# Patient Record
Sex: Female | Born: 1965
Health system: Southern US, Community
[De-identification: ages and names within clinical notes are randomized; demographics above are authoritative.]

## PROBLEM LIST (undated history)

## (undated) DIAGNOSIS — M545 Low back pain, unspecified: Secondary | ICD-10-CM

## (undated) DIAGNOSIS — R739 Hyperglycemia, unspecified: Secondary | ICD-10-CM

## (undated) DIAGNOSIS — K219 Gastro-esophageal reflux disease without esophagitis: Secondary | ICD-10-CM

## (undated) DIAGNOSIS — R600 Localized edema: Secondary | ICD-10-CM

## (undated) DIAGNOSIS — E663 Overweight: Secondary | ICD-10-CM

## (undated) DIAGNOSIS — I10 Essential (primary) hypertension: Secondary | ICD-10-CM

## (undated) DIAGNOSIS — F419 Anxiety disorder, unspecified: Secondary | ICD-10-CM

## (undated) DIAGNOSIS — J189 Pneumonia, unspecified organism: Secondary | ICD-10-CM

## (undated) HISTORY — DX: Gastro-esophageal reflux disease without esophagitis: K21.9

## (undated) HISTORY — PX: TONSILLECTOMY: SUR1361

## (undated) HISTORY — PX: LUMBAR SPINE SURGERY: SHX701

## (undated) HISTORY — DX: Anxiety disorder, unspecified: F41.9

## (undated) HISTORY — DX: Hyperglycemia, unspecified: R73.9

## (undated) HISTORY — DX: Low back pain, unspecified: M54.50

## (undated) HISTORY — DX: Overweight: E66.3

## (undated) HISTORY — PX: TUBAL LIGATION: SHX77

## (undated) HISTORY — DX: Essential (primary) hypertension: I10

## (undated) HISTORY — DX: Localized edema: R60.0

## (undated) HISTORY — DX: Pneumonia, unspecified organism: J18.9

---
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
## 1991-10-05 HISTORY — PX: ANAL FISSURE REPAIR: SHX2312

## 1999-07-02 ENCOUNTER — Other Ambulatory Visit: Admission: RE | Admit: 1999-07-02 | Discharge: 1999-07-02 | Payer: Self-pay | Admitting: Obstetrics and Gynecology

## 2000-05-02 ENCOUNTER — Encounter: Payer: Self-pay | Admitting: Internal Medicine

## 2000-05-02 ENCOUNTER — Emergency Department (HOSPITAL_COMMUNITY): Admission: EM | Admit: 2000-05-02 | Discharge: 2000-05-02 | Payer: Self-pay | Admitting: Internal Medicine

## 2000-05-17 ENCOUNTER — Other Ambulatory Visit: Admission: RE | Admit: 2000-05-17 | Discharge: 2000-05-17 | Payer: Self-pay | Admitting: Obstetrics and Gynecology

## 2000-10-04 HISTORY — PX: FOOT SURGERY: SHX648

## 2001-05-22 ENCOUNTER — Other Ambulatory Visit: Admission: RE | Admit: 2001-05-22 | Discharge: 2001-05-22 | Payer: Self-pay | Admitting: Obstetrics and Gynecology

## 2002-08-02 ENCOUNTER — Inpatient Hospital Stay (HOSPITAL_COMMUNITY): Admission: AD | Admit: 2002-08-02 | Discharge: 2002-08-04 | Payer: Self-pay | Admitting: Obstetrics and Gynecology

## 2002-09-04 ENCOUNTER — Other Ambulatory Visit: Admission: RE | Admit: 2002-09-04 | Discharge: 2002-09-04 | Payer: Self-pay | Admitting: Obstetrics & Gynecology

## 2003-11-01 ENCOUNTER — Other Ambulatory Visit: Admission: RE | Admit: 2003-11-01 | Discharge: 2003-11-01 | Payer: Self-pay | Admitting: Obstetrics & Gynecology

## 2004-09-09 ENCOUNTER — Ambulatory Visit: Payer: Self-pay | Admitting: Pulmonary Disease

## 2004-10-02 ENCOUNTER — Ambulatory Visit: Payer: Self-pay | Admitting: Pulmonary Disease

## 2004-11-16 ENCOUNTER — Other Ambulatory Visit: Admission: RE | Admit: 2004-11-16 | Discharge: 2004-11-16 | Payer: Self-pay | Admitting: Obstetrics & Gynecology

## 2005-06-08 ENCOUNTER — Ambulatory Visit: Payer: Self-pay | Admitting: Pulmonary Disease

## 2005-11-24 ENCOUNTER — Other Ambulatory Visit: Admission: RE | Admit: 2005-11-24 | Discharge: 2005-11-24 | Payer: Self-pay | Admitting: Obstetrics & Gynecology

## 2005-12-10 ENCOUNTER — Emergency Department (HOSPITAL_COMMUNITY): Admission: EM | Admit: 2005-12-10 | Discharge: 2005-12-11 | Payer: Self-pay | Admitting: Emergency Medicine

## 2005-12-13 ENCOUNTER — Ambulatory Visit: Payer: Self-pay | Admitting: Pulmonary Disease

## 2005-12-14 ENCOUNTER — Emergency Department (HOSPITAL_COMMUNITY): Admission: EM | Admit: 2005-12-14 | Discharge: 2005-12-14 | Payer: Self-pay | Admitting: Emergency Medicine

## 2005-12-15 ENCOUNTER — Ambulatory Visit: Payer: Self-pay | Admitting: Pulmonary Disease

## 2005-12-20 ENCOUNTER — Ambulatory Visit: Payer: Self-pay | Admitting: Internal Medicine

## 2005-12-30 ENCOUNTER — Encounter: Payer: Self-pay | Admitting: Internal Medicine

## 2005-12-30 ENCOUNTER — Ambulatory Visit: Payer: Self-pay | Admitting: Internal Medicine

## 2005-12-30 ENCOUNTER — Ambulatory Visit: Payer: Self-pay

## 2006-01-26 ENCOUNTER — Ambulatory Visit: Payer: Self-pay | Admitting: Pulmonary Disease

## 2007-05-30 ENCOUNTER — Ambulatory Visit: Payer: Self-pay | Admitting: Pulmonary Disease

## 2007-09-26 ENCOUNTER — Telehealth (INDEPENDENT_AMBULATORY_CARE_PROVIDER_SITE_OTHER): Payer: Self-pay | Admitting: *Deleted

## 2007-10-04 ENCOUNTER — Encounter: Payer: Self-pay | Admitting: Pulmonary Disease

## 2007-10-04 DIAGNOSIS — F411 Generalized anxiety disorder: Secondary | ICD-10-CM | POA: Insufficient documentation

## 2007-10-04 DIAGNOSIS — I10 Essential (primary) hypertension: Secondary | ICD-10-CM | POA: Insufficient documentation

## 2008-09-30 ENCOUNTER — Telehealth (INDEPENDENT_AMBULATORY_CARE_PROVIDER_SITE_OTHER): Payer: Self-pay | Admitting: *Deleted

## 2008-10-02 ENCOUNTER — Ambulatory Visit: Payer: Self-pay | Admitting: Internal Medicine

## 2008-10-07 LAB — CONVERTED CEMR LAB
BUN: 8 mg/dL (ref 6–23)
Basophils Absolute: 0 10*3/uL (ref 0.0–0.1)
Basophils Relative: 0.6 % (ref 0.0–3.0)
CO2: 27 meq/L (ref 19–32)
Calcium: 9.4 mg/dL (ref 8.4–10.5)
Chloride: 105 meq/L (ref 96–112)
Creatinine, Ser: 0.5 mg/dL (ref 0.4–1.2)
Eosinophils Absolute: 0 10*3/uL (ref 0.0–0.7)
Eosinophils Relative: 0.4 % (ref 0.0–5.0)
GFR calc Af Amer: 175 mL/min
GFR calc non Af Amer: 145 mL/min
Glucose, Bld: 90 mg/dL (ref 70–99)
HCT: 37.2 % (ref 36.0–46.0)
Hemoglobin: 12.8 g/dL (ref 12.0–15.0)
Lymphocytes Relative: 19.7 % (ref 12.0–46.0)
MCHC: 34.3 g/dL (ref 30.0–36.0)
MCV: 85.9 fL (ref 78.0–100.0)
Monocytes Absolute: 0.5 10*3/uL (ref 0.1–1.0)
Monocytes Relative: 6.6 % (ref 3.0–12.0)
Neutro Abs: 6.2 10*3/uL (ref 1.4–7.7)
Neutrophils Relative %: 72.7 % (ref 43.0–77.0)
Platelets: 201 10*3/uL (ref 150–400)
Potassium: 4.2 meq/L (ref 3.5–5.1)
RBC: 4.32 M/uL (ref 3.87–5.11)
RDW: 13 % (ref 11.5–14.6)
Sodium: 136 meq/L (ref 135–145)
TSH: 1.81 microintl units/mL (ref 0.35–5.50)
WBC: 8.3 10*3/uL (ref 4.5–10.5)
hCG, Beta Chain, Quant, S: 139362 milliintl units/mL

## 2009-04-18 ENCOUNTER — Inpatient Hospital Stay (HOSPITAL_COMMUNITY): Admission: AD | Admit: 2009-04-18 | Discharge: 2009-04-23 | Payer: Self-pay | Admitting: Obstetrics & Gynecology

## 2009-04-24 ENCOUNTER — Encounter: Admission: RE | Admit: 2009-04-24 | Discharge: 2009-05-24 | Payer: Self-pay | Admitting: Obstetrics & Gynecology

## 2009-05-25 ENCOUNTER — Encounter: Admission: RE | Admit: 2009-05-25 | Discharge: 2009-06-24 | Payer: Self-pay | Admitting: Obstetrics & Gynecology

## 2009-06-25 ENCOUNTER — Encounter: Admission: RE | Admit: 2009-06-25 | Discharge: 2009-07-24 | Payer: Self-pay | Admitting: Obstetrics & Gynecology

## 2009-07-07 ENCOUNTER — Ambulatory Visit: Payer: Self-pay | Admitting: Pulmonary Disease

## 2009-07-07 LAB — CONVERTED CEMR LAB
ALT: 29 units/L (ref 0–35)
Albumin: 4.2 g/dL (ref 3.5–5.2)
BUN: 12 mg/dL (ref 6–23)
CO2: 31 meq/L (ref 19–32)
Calcium: 9.8 mg/dL (ref 8.4–10.5)
Creatinine, Ser: 0.6 mg/dL (ref 0.4–1.2)
Glucose, Bld: 85 mg/dL (ref 70–99)
Total Protein: 7.3 g/dL (ref 6.0–8.3)

## 2009-07-18 ENCOUNTER — Telehealth (INDEPENDENT_AMBULATORY_CARE_PROVIDER_SITE_OTHER): Payer: Self-pay | Admitting: *Deleted

## 2009-07-23 ENCOUNTER — Ambulatory Visit: Payer: Self-pay | Admitting: Pulmonary Disease

## 2009-07-25 ENCOUNTER — Encounter: Admission: RE | Admit: 2009-07-25 | Discharge: 2009-08-08 | Payer: Self-pay | Admitting: Obstetrics & Gynecology

## 2009-10-22 ENCOUNTER — Ambulatory Visit: Payer: Self-pay | Admitting: Pulmonary Disease

## 2009-10-23 DIAGNOSIS — E669 Obesity, unspecified: Secondary | ICD-10-CM

## 2009-10-24 ENCOUNTER — Ambulatory Visit: Payer: Self-pay | Admitting: Pulmonary Disease

## 2009-11-09 LAB — CONVERTED CEMR LAB
ALT: 20 units/L (ref 0–35)
AST: 21 units/L (ref 0–37)
Basophils Absolute: 0 10*3/uL (ref 0.0–0.1)
Basophils Relative: 0.7 % (ref 0.0–3.0)
Bilirubin Urine: NEGATIVE
Bilirubin, Direct: 0.1 mg/dL (ref 0.0–0.3)
Calcium: 9 mg/dL (ref 8.4–10.5)
Cholesterol: 138 mg/dL (ref 0–200)
Eosinophils Absolute: 0.1 10*3/uL (ref 0.0–0.7)
GFR calc non Af Amer: 116.16 mL/min (ref >60)
HDL: 42.7 mg/dL (ref >39.00)
Lymphocytes Relative: 22.3 % (ref 12.0–46.0)
MCHC: 33.1 g/dL (ref 30.0–36.0)
MCV: 89.1 fL (ref 78.0–100.0)
Monocytes Absolute: 0.3 10*3/uL (ref 0.1–1.0)
Neutrophils Relative %: 70.2 % (ref 43.0–77.0)
Nitrite: NEGATIVE
Platelets: 181 10*3/uL (ref 150.0–400.0)
Potassium: 3.8 meq/L (ref 3.5–5.1)
RBC: 4.39 M/uL (ref 3.87–5.11)
Sodium: 139 meq/L (ref 135–145)
TSH: 1.57 microintl units/mL (ref 0.35–5.50)
Total Protein, Urine: NEGATIVE mg/dL
Total Protein: 7.1 g/dL (ref 6.0–8.3)
Urobilinogen, UA: 0.2 (ref 0.0–1.0)
VLDL: 13.8 mg/dL (ref 0.0–40.0)
WBC: 5.7 10*3/uL (ref 4.5–10.5)

## 2010-07-10 ENCOUNTER — Ambulatory Visit (HOSPITAL_COMMUNITY): Admission: RE | Admit: 2010-07-10 | Discharge: 2010-07-10 | Payer: Self-pay | Admitting: Obstetrics and Gynecology

## 2010-11-03 NOTE — Assessment & Plan Note (Signed)
Summary: physical/mhh   CC:  2.5 yr ROV & CPX....  History of Present Illness: 45 y/o WF here for a follow up visit and CPX...    Current Problems:   PHYSICAL EXAMINATION (ICD-V70.0) -  ** Flu shot July 07, 2009  &  DTAP -July 23, 2009...  Hx of PNEUMONIA (ICD-486) - this occured in 2000 & resolved w/ antibiotic Rx.  HYPERTENSION (ICD-401.9) - on CARTIA XT 240mg /d & LISINOPRIL/ HCT 20-12.5 daily... BP= 114/80 & similar at home... takes meds regularly & tolerates well...  denies HA, fatigue, visual changes, CP, palipit, dizziness, syncope, dyspnea, edema, etc...   Hx of CHEST PAIN (ICD-786.50) - s/p cardiac eval 2007 by DrBensimhon >> normal 2DEcho & norm Myoview w/ EF= 72%... also neg holter monitor- all NSR, no arrhythmias... baseline EKG w/ NSR, WNL.Marland KitchenMarland Kitchen  OVERWEIGHT (ICD-278.02) - discussed diet + exercise program...  ~  weight 8/08 = 175#  ~  weight 1/11 = 191#  ESOPHAGEAL REFLUX (ICD-530.81) - hx reflux symptoms controlled w/ OTC PPI & H2blockers... she had EGD's in '85 & '90 which showed mild GERD, otherw neg.  IRRITABLE BOWEL SYNDROME, HX OF (ICD-V12.79) - denies current symptoms... hx prev FlexSig 1990 that was neg...  ? of GALLSTONES (ICD-574.20) - Sonar 1995 showed sm gallstone... no symptoms & denies abd pain, N/ V/ etc...  ? of DISORDER, ADRENAL GLAND (ICD-255.9) - incidental left adrenal adenoma seen on CT Abd in 2007  Hx of SCOLIOSIS (ICD-737.30) - seen on XRays in 1980's... she denies back pain, stiffness, etc...   Hx of DISORDER OF BONE AND CARTILAGE UNSPECIFIED (ICD-733.90) - prev XRAys also revealed a ?cystic lesion in left pubic ramus- she denies pelvic pain, etc...  ANXIETY (ICD-300.00) - uses ALPRAZOLAM 0.5mg  1/2 to 1 tab  Tid Prn...    Allergies: 1)  ! Pcn  Comments:  Nurse/Medical Assistant: The patient's medications and allergies were reviewed with the patient and were updated in the Medication and Allergy Lists.  Past History:  Past Medical  History:  Hx of PNEUMONIA (ICD-486) HYPERTENSION (ICD-401.9) Hx of CHEST PAIN (ICD-786.50) OVERWEIGHT (ICD-278.02) ESOPHAGEAL REFLUX (ICD-530.81) IRRITABLE BOWEL SYNDROME, HX OF (ICD-V12.79) ? of GALLSTONES (ICD-574.20) ? of DISORDER, ADRENAL GLAND (ICD-255.9) Hx of SCOLIOSIS (ICD-737.30) Hx of DISORDER OF BONE AND CARTILAGE UNSPECIFIED (ICD-733.90) ANXIETY (ICD-300.00)       Past Surgical History: S/P T & A at age 48 S/P surg for anal fissure 1993 by Felipa Furnace  Family History: Father alive age 80 w/ hx HBP & chol Mother alive age 53 w/ DM, Chol, HBP 1 Sibling- Brother alive & well  emphysema - MGF rheumatism - mother cancer - MGM (breast), PGM (breast) hypercholesterolemia - mother, father DM - mother HBP - mother, father, both sets grandparents  Social History: Married, husb= Lewis, 8 yrs. 2 Children Never smoked Social alcohol currently unemployed- Oncologist  Review of Systems  The patient denies fever, chills, sweats, anorexia, fatigue, weakness, malaise, weight loss, sleep disorder, blurring, diplopia, eye irritation, eye discharge, vision loss, eye pain, photophobia, earache, ear discharge, tinnitus, decreased hearing, nasal congestion, nosebleeds, sore throat, hoarseness, chest pain, palpitations, syncope, dyspnea on exertion, orthopnea, PND, peripheral edema, cough, dyspnea at rest, excessive sputum, hemoptysis, wheezing, pleurisy, nausea, vomiting, diarrhea, constipation, change in bowel habits, abdominal pain, melena, hematochezia, jaundice, gas/bloating, indigestion/heartburn, dysphagia, odynophagia, dysuria, hematuria, urinary frequency, urinary hesitancy, nocturia, incontinence, back pain, joint pain, joint swelling, muscle cramps, muscle weakness, stiffness, arthritis, sciatica, restless legs, leg pain at night, leg pain with exertion,  rash, itching, dryness, suspicious lesions, paralysis, paresthesias, seizures, tremors, vertigo, transient blindness, frequent  falls, frequent headaches, difficulty walking, depression, anxiety, memory loss, confusion, cold intolerance, heat intolerance, polydipsia, polyphagia, polyuria, unusual weight change, abnormal bruising, bleeding, enlarged lymph nodes, urticaria, allergic rash, hay fever, and recurrent infections.    Vital Signs:  Patient profile:   45 year old female Height:      65 inches Weight:      191 pounds O2 Sat:      98 % on Room air Temp:     97.2 degrees F oral Pulse rate:   74 / minute BP sitting:   114 / 80  (right arm) Cuff size:   regular  Vitals Entered By: Randell Loop CMA (October 22, 2009 8:58 AM)  O2 Sat at Rest %:  98 O2 Flow:  Room air  CC: 2.5 yr ROV & CPX... Is Patient Diabetic? No Pain Assessment Patient in pain? no      Comments no changes in meds   Physical Exam  Additional Exam:  WD, Overweight, 45 y/o WF in NAD... GENERAL:  Alert & oriented; pleasant & cooperative... HEENT:  [place]/AT, EOM-wnl, PERRLA, Fundi-benign, EACs-clear, TMs-wnl, NOSE-clear, THROAT-clear & wnl. NECK:  Supple w/ full ROM; no JVD; normal carotid impulses w/o bruits; no thyromegaly or nodules palpated; no lymphadenopathy. CHEST:  Clear to P & A; without wheezes/ rales/ or rhonchi. HEART:  Regular Rhythm; without murmurs/ rubs/ or gallops. ABDOMEN:  Soft & nontender; normal bowel sounds; no organomegaly or masses detected. EXT: without deformities or arthritic changes; no varicose veins/ venous insuffic/ or edema. NEURO:  CN's intact; motor testing normal; sensory testing normal; gait normal & balance OK. DERM:  No lesions noted; no rash etc...     CXR  Procedure date:  10/22/2009  Findings:      CHEST - 2 VIEW 10/22/2009:   Comparison: Two-view chest x-ray 12/14/2005 and CT chest 12/10/2005.   Findings: Cardiomediastinal silhouette unremarkable.  Lungs clear. Bronchovascular markings normal.  Pulmonary vascularity normal.  No pleural effusions.  No pneumothorax.  Visualized bony  thorax intact.   No significant interval change.   IMPRESSION: Normal and stable chest.   Read By:  Arnell Sieving,  M.D.   EKG  Procedure date:  10/22/2009  Findings:      Normal sinus rhythm with rate of:  66/ min... Tracing is WNL, NAD...  SN   MISC. Report  Procedure date:  10/22/2009  Findings:      She will return to lab for her FASTING blood work...  SN   Impression & Recommendations:  Problem # 1:  PHYSICAL EXAMINATION (ICD-V70.0)  Orders: EKG w/ Interpretation (93000) T-2 View CXR (71020TC) Review Fasting labs when avail...  Problem # 2:  Hx of CHEST PAIN (ICD-786.50) No CP-  doing satis...  Problem # 3:  OVERWEIGHT (ICD-278.02) Diet + exercise are the keys to weight reduction... discussed w/ pt...  Problem # 4:  ESOPHAGEAL REFLUX (ICD-530.81) No recent symptoms-  she uses OTC Rx as needed...  Problem # 5:  Hx of SCOLIOSIS (ICD-737.30) She denies back pain etc...  Problem # 6:  ANXIETY (ICD-300.00) Refill Alpraz Prn... Her updated medication list for this problem includes:    Alprazolam 0.5 Mg Tabs (Alprazolam) .Marland Kitchen... Take 1/2 to 1 tab by mouth three times a day as needed for nerves...  Complete Medication List: 1)  Cartia Xt 240 Mg Xr24h-cap (Diltiazem hcl coated beads) .Marland Kitchen.. 1 by mouth once daily 2)  Lisinopril-hydrochlorothiazide 20-12.5 Mg Tabs (Lisinopril-hydrochlorothiazide) .Marland Kitchen.. 1 by mouth once daily 3)  Omega-3 350 Mg Caps (Omega-3 fatty acids) .... 1/2 two times a day 4)  Bp Multinatal Plus 30-1 Mg Tabs (Prenatal w/o a vit-fe fum-fa) .... Take 1 tablet by mouth once a day 5)  Alprazolam 0.5 Mg Tabs (Alprazolam) .... Take 1/2 to 1 tab by mouth three times a day as needed for nerves...  Other Orders: Prescription Created Electronically [phone])  Patient Instructions: 1)  Today we updated your med list- see below.... 2)  Continue your current meds the same... 3)  We refilled your Alprazolam as discussed... 4)  Today we did your follow  up CXR & EKG... 5)  Please return to our lab one morning this week for your FASTING blood work... please call the "phone tree" in a few days for your lab results.Marland KitchenMarland Kitchen 6)  Keep up the good work w/ diet + exercise program!!! 7)  Call for any questions... 8)  Please schedule a follow-up appointment in 1 year, sooner as needed. Prescriptions: ALPRAZOLAM 0.5 MG TABS (ALPRAZOLAM) take 1/2 to 1 tab by mouth three times a day as needed for nerves...  #90 x 5   Entered and Authorized by:   Michele Mcalpine MD   Signed by:   Michele Mcalpine MD on 10/22/2009   Method used:   Print then Give to Patient   RxID:   [id number]    Card[facility]Perfect ECG  ID: [id number] Patient: Dicky Doe DOB: 09/15/1966 Age: 45 Years Old Sex: Female Race: White Physician: Hans Rusher,s Technician: Randell Loop CMA Height: 65 Weight: 191 Status: Unconfirmed Past Medical History:  1. HYPERTENSION (ICD-401.9)--rx Cartia 240mg  daily and Lisinopril 20/12.5mg  once daily (restarted postpartum- 07/07/2009).   2. Atypical Chest pain--stress cardiolite 12/2005- neg for ischemia (? breast attenuation)                                        2 D echo normal LVF, EF 60% 3. GERD-reflux esophagitis--prev. endo- per Jarold Motto 4.  IRRITABLE BOWEL SYNDROME, HX OF (ICD-V12.79)--prev. colon 1994 (normal ).   5. Anal fissure repair--1993 per Dr. Kendrick Ranch   Flu shot July 07, 2009  DTAP -July 23, 2009    Recorded: 10/22/2009 09:14 AM P/PR: 107 ms / 140 ms - Heart rate (maximum exercise) QRS: 92 QT/QTc/QTd: 397 ms / 408 ms / 41 ms - Heart rate (maximum exercise)  P/QRS/T axis: -15 deg / 53 deg / 16 deg - Heart rate (maximum exercise)  Heartrate: 66 bpm  Interpretation:  Normal sinus rhythm with rate of:  66/ min... Tracing is WNL, NAD...  SN

## 2010-12-17 LAB — COMPREHENSIVE METABOLIC PANEL
AST: 22 U/L (ref 0–37)
CO2: 29 mEq/L (ref 19–32)
Calcium: 9.4 mg/dL (ref 8.4–10.5)
Chloride: 107 mEq/L (ref 96–112)
Creatinine, Ser: 0.62 mg/dL (ref 0.4–1.2)
GFR calc Af Amer: >60 mL/min (ref >60)
GFR calc non Af Amer: >60 mL/min (ref >60)
Glucose, Bld: 98 mg/dL (ref 70–99)
Total Bilirubin: 0.3 mg/dL (ref 0.3–1.2)

## 2010-12-17 LAB — SURGICAL PCR SCREEN
MRSA, PCR: NEGATIVE
Staphylococcus aureus: NEGATIVE

## 2010-12-17 LAB — URINALYSIS, ROUTINE W REFLEX MICROSCOPIC
Bilirubin Urine: NEGATIVE
Glucose, UA: NEGATIVE mg/dL
Ketones, ur: NEGATIVE mg/dL
Nitrite: NEGATIVE
Specific Gravity, Urine: <1.005 — ABNORMAL LOW (ref 1.005–1.030)
pH: 6 (ref 5.0–8.0)

## 2010-12-17 LAB — CBC
HCT: 37.8 % (ref 36.0–46.0)
Hemoglobin: 12.6 g/dL (ref 12.0–15.0)
MCH: 28.3 pg (ref 26.0–34.0)
MCHC: 33.3 g/dL (ref 30.0–36.0)
MCV: 85.1 fL (ref 78.0–100.0)
RBC: 4.44 MIL/uL (ref 3.87–5.11)

## 2010-12-17 LAB — PROTIME-INR: Prothrombin Time: 14.2 seconds (ref 11.6–15.2)

## 2010-12-17 LAB — APTT: aPTT: 29 seconds (ref 24–37)

## 2010-12-17 LAB — URINE MICROSCOPIC-ADD ON

## 2011-01-10 LAB — COMPREHENSIVE METABOLIC PANEL
ALT: 31 U/L (ref 0–35)
ALT: 42 U/L — ABNORMAL HIGH (ref 0–35)
AST: 33 U/L (ref 0–37)
AST: 38 U/L — ABNORMAL HIGH (ref 0–37)
AST: 41 U/L — ABNORMAL HIGH (ref 0–37)
AST: 62 U/L — ABNORMAL HIGH (ref 0–37)
Albumin: 2.3 g/dL — ABNORMAL LOW (ref 3.5–5.2)
Albumin: 2.6 g/dL — ABNORMAL LOW (ref 3.5–5.2)
Alkaline Phosphatase: 103 U/L (ref 39–117)
Alkaline Phosphatase: 73 U/L (ref 39–117)
Alkaline Phosphatase: 83 U/L (ref 39–117)
Alkaline Phosphatase: 92 U/L (ref 39–117)
BUN: 11 mg/dL (ref 6–23)
BUN: 11 mg/dL (ref 6–23)
BUN: 15 mg/dL (ref 6–23)
CO2: 22 mEq/L (ref 19–32)
CO2: 27 mEq/L (ref 19–32)
CO2: 29 mEq/L (ref 19–32)
Calcium: 8.3 mg/dL — ABNORMAL LOW (ref 8.4–10.5)
Chloride: 103 mEq/L (ref 96–112)
Chloride: 103 mEq/L (ref 96–112)
Chloride: 104 mEq/L (ref 96–112)
Creatinine, Ser: 0.47 mg/dL (ref 0.4–1.2)
Creatinine, Ser: 0.72 mg/dL (ref 0.4–1.2)
GFR calc Af Amer: >60 mL/min (ref >60)
GFR calc Af Amer: >60 mL/min (ref >60)
GFR calc Af Amer: >60 mL/min (ref >60)
GFR calc non Af Amer: >60 mL/min (ref >60)
GFR calc non Af Amer: >60 mL/min (ref >60)
GFR calc non Af Amer: >60 mL/min (ref >60)
Glucose, Bld: 70 mg/dL (ref 70–99)
Glucose, Bld: 72 mg/dL (ref 70–99)
Glucose, Bld: 82 mg/dL (ref 70–99)
Potassium: 3.7 mEq/L (ref 3.5–5.1)
Potassium: 3.9 mEq/L (ref 3.5–5.1)
Potassium: 4.4 mEq/L (ref 3.5–5.1)
Sodium: 137 mEq/L (ref 135–145)
Sodium: 138 mEq/L (ref 135–145)
Total Bilirubin: 0.1 mg/dL — ABNORMAL LOW (ref 0.3–1.2)
Total Bilirubin: 0.2 mg/dL — ABNORMAL LOW (ref 0.3–1.2)
Total Bilirubin: 0.3 mg/dL (ref 0.3–1.2)
Total Protein: 5.4 g/dL — ABNORMAL LOW (ref 6.0–8.3)

## 2011-01-10 LAB — CBC
HCT: 28.6 % — ABNORMAL LOW (ref 36.0–46.0)
HCT: 33.9 % — ABNORMAL LOW (ref 36.0–46.0)
Hemoglobin: 10.4 g/dL — ABNORMAL LOW (ref 12.0–15.0)
Hemoglobin: 11.4 g/dL — ABNORMAL LOW (ref 12.0–15.0)
Hemoglobin: 13.7 g/dL (ref 12.0–15.0)
Hemoglobin: 9.6 g/dL — ABNORMAL LOW (ref 12.0–15.0)
MCHC: 33 g/dL (ref 30.0–36.0)
MCHC: 33.2 g/dL (ref 30.0–36.0)
MCHC: 33.8 g/dL (ref 30.0–36.0)
MCV: 86.3 fL (ref 78.0–100.0)
MCV: 88.1 fL (ref 78.0–100.0)
Platelets: 121 10*3/uL — ABNORMAL LOW (ref 150–400)
Platelets: 137 10*3/uL — ABNORMAL LOW (ref 150–400)
RBC: 3.43 MIL/uL — ABNORMAL LOW (ref 3.87–5.11)
RBC: 3.52 MIL/uL — ABNORMAL LOW (ref 3.87–5.11)
RBC: 3.83 MIL/uL — ABNORMAL LOW (ref 3.87–5.11)
RBC: 3.93 MIL/uL (ref 3.87–5.11)
RBC: 4.63 MIL/uL (ref 3.87–5.11)
RDW: 15 % (ref 11.5–15.5)
RDW: 15.6 % — ABNORMAL HIGH (ref 11.5–15.5)
RDW: 15.6 % — ABNORMAL HIGH (ref 11.5–15.5)
RDW: 15.9 % — ABNORMAL HIGH (ref 11.5–15.5)
WBC: 15 10*3/uL — ABNORMAL HIGH (ref 4.0–10.5)
WBC: 6.2 10*3/uL (ref 4.0–10.5)
WBC: 6.6 10*3/uL (ref 4.0–10.5)
WBC: 8.2 10*3/uL (ref 4.0–10.5)
WBC: 9.3 10*3/uL (ref 4.0–10.5)

## 2011-01-10 LAB — ALT: ALT: 122 U/L — ABNORMAL HIGH (ref 0–35)

## 2011-01-10 LAB — LACTATE DEHYDROGENASE: LDH: 167 U/L (ref 94–250)

## 2011-01-10 LAB — RPR: RPR Ser Ql: NONREACTIVE

## 2011-01-27 ENCOUNTER — Other Ambulatory Visit (INDEPENDENT_AMBULATORY_CARE_PROVIDER_SITE_OTHER): Payer: BC Managed Care – PPO

## 2011-01-27 ENCOUNTER — Encounter: Payer: Self-pay | Admitting: Adult Health

## 2011-01-27 ENCOUNTER — Ambulatory Visit (INDEPENDENT_AMBULATORY_CARE_PROVIDER_SITE_OTHER): Payer: BC Managed Care – PPO | Admitting: Adult Health

## 2011-01-27 VITALS — BP 140/90 | HR 98 | Temp 97.2°F | Ht 65.0 in | Wt 193.4 lb

## 2011-01-27 DIAGNOSIS — R42 Dizziness and giddiness: Secondary | ICD-10-CM

## 2011-01-27 DIAGNOSIS — I10 Essential (primary) hypertension: Secondary | ICD-10-CM

## 2011-01-27 LAB — BASIC METABOLIC PANEL
CO2: 31 mEq/L (ref 19–32)
Calcium: 9.3 mg/dL (ref 8.4–10.5)
Chloride: 104 mEq/L (ref 96–112)
Glucose, Bld: 85 mg/dL (ref 70–99)
Potassium: 4.4 mEq/L (ref 3.5–5.1)
Sodium: 140 mEq/L (ref 135–145)

## 2011-01-27 LAB — CBC WITH DIFFERENTIAL/PLATELET
Basophils Relative: 0.3 % (ref 0.0–3.0)
Eosinophils Absolute: 0 10*3/uL (ref 0.0–0.7)
Eosinophils Relative: 0.8 % (ref 0.0–5.0)
Lymphocytes Relative: 24.5 % (ref 12.0–46.0)
MCHC: 33.9 g/dL (ref 30.0–36.0)
Neutrophils Relative %: 67.9 % (ref 43.0–77.0)
RBC: 4.55 Mil/uL (ref 3.87–5.11)
WBC: 5.7 10*3/uL (ref 4.5–10.5)

## 2011-01-27 MED ORDER — MECLIZINE HCL 25 MG PO TABS: 25.0000 mg | ORAL_TABLET | Freq: Three times a day (TID) | ORAL | Status: DC | PRN

## 2011-01-27 NOTE — Patient Instructions (Signed)
Use Meclizine 25mg  1/2-1 Three times a day  As needed  Dizziness Change positions slowly .  Increase fluids.  I will call with labs.  See Vertigo instruction sheet.

## 2011-01-27 NOTE — Assessment & Plan Note (Signed)
Slightly elevated. Advised on low salt diet Take meds consistently.  follow up in 3 months  Call if b/p remains elevated >150/90

## 2011-01-27 NOTE — Progress Notes (Signed)
  Subjective:    Patient ID: Karina Hawkins, female    DOB: [birth year], 45 y.o.   MRN: [id number]  HPI 45 yo WF with known hx of HTN, GERD, Anxiety   01/27/2011 Acute OV Presents for intermittent dizziness for 2 weeks, increased for the last 3 days.  Has forgot b/p meds for few days this week. Did not take b/p this am , b/p is up ~140/90.  Started out with sinus pressure/tenderss-maxillary, teeth pain and gum pain, frontal headache.  Took sudafed for few days without any help. Feels room is spinning.  No sinus drainage.  Denies visual/speech changes, extremity weakness.  No recent injury or travel.  No recent illness or antibiotic use.  Has started on protein shakes recently.  At rest and walking no symptoms.  Occurs with bending over, turning in bed, looking to side and quick movements.  No syncope.     Review of Systems Constitutional:   No  weight loss, night sweats,  Fevers, chills  HEENT:   No headaches,  Difficulty swallowing,  Tooth/dental problems, or  Sore throat,                No sneezing, itching, ear ache, post nasal drip,   CV:  No chest pain,  Orthopnea, PND, swelling in lower extremities, anasarca, palpitations, syncope.   GI  No heartburn, indigestion, abdominal pain, nausea, vomiting, diarrhea, change in bowel habits, loss of appetite, bloody stools.   Resp: No shortness of breath with exertion or at rest.  No excess mucus, no productive cough,  No non-productive cough,  No coughing up of blood.  No change in color of mucus.  No wheezing.  No chest wall deformity  Skin: no rash or lesions.  GU: no dysuria, change in color of urine, no urgency or frequency.  No flank pain, no hematuria   MS:  No joint pain or swelling.  No decreased range of motion.  No back pain.  Psych:  No change in mood or affect. No depression or anxiety.  No memory loss.  Neuro: no speech/visual changes         Objective:   Physical Exam GEN: A/Ox3; pleasant , NAD, well nourished     HEENT:  [place]/AT,  EACs-clear, TMs-wnl, NOSE-clear, THROAT-clear, no lesions, no postnasal drip or exudate noted.   NECK:  Supple w/ fair ROM; no JVD; normal carotid impulses w/o bruits; no thyromegaly or nodules palpated; no lymphadenopathy.  RESP  Clear  P & A; w/o, wheezes/ rales/ or rhonchi.no accessory muscle use, no dullness to percussion  CARD:  RRR, no m/r/g  , no peripheral edema, pulses intact, no cyanosis or clubbing.  GI:   Soft & nt; nml bowel sounds; no organomegaly or masses detected.  Musco: Warm bil, no deformities or joint swelling noted.   Neuro: alert, no focal deficits noted, PERRLA, EOMI without nystagmus, nml gait, nml hand grips equal strength bilaterally Head manuevrs without nystagmus -+ reproducible symptoms.   Skin: Warm, no lesions or rashes         Assessment & Plan:

## 2011-01-27 NOTE — Assessment & Plan Note (Signed)
Suspected vertigo, exam unrevealing  Labs pending.  Plan Meclizine tid As needed   Fluids and rest.  Please contact office for sooner follow up if symptoms do not improve or worsen or seek emergency care

## 2011-02-16 NOTE — Discharge Summary (Signed)
NAME:  Karina Hawkins, Karina Hawkins                ACCOUNT NO.:  [id number]   MEDICAL RECORD NO.:  [id number]          PATIENT TYPE:  INP   LOCATION:  9117                          FACILITY:  [facility]   PHYSICIAN:  Randye Lobo, M.D.   DATE OF BIRTH:  03-Feb-1966   DATE OF ADMISSION:  04/18/2009  DATE OF DISCHARGE:  04/23/2009                               DISCHARGE SUMMARY   FINAL DIAGNOSES:  1. Intrauterine pregnancy at 75 weeks' gestation.  2. Spontaneous rupture of membranes.  3. Positive group B streptococcus.  4. Spontaneous vaginal delivery of a female infant with Apgars of 5      and 9.  5. The patient with chronic hypertension and elevated liver enzymes.   DELIVERY:  Spontaneous vaginal delivery performed by Dr. Carrington Clamp.   COMPLICATIONS:  None.   This 45 year old, G2, P1-0-0-1, presents at 85 weeks' gestation with  spontaneous rupture of membranes.  The patient's antepartum course had  been mostly uncomplicated.  She was advanced for maternal age and had a  first trimester screen that returned within normal limits.  The patient  also has a history of a PENICILLIN allergy and is positive to group B  strep, which showed resistance to clindamycin.  The patient has chronic  hypertension, was not on any medications during this pregnancy.  The  patient was admitted at 38 weeks with spontaneous rupture of membranes.  The patient was able to be started on Ancef for her positive group B  strep status.  She dilated to complete and complete, and had spontaneous  vaginal delivery of a 7 pounds 3 ounces female infant with Apgars of 5  and 9.  There was a nuchal cord x1 that was reduced and a cord gas of  7.09.  The baby responded quickly to stimulation and blow-by.  There was  a first-degree perineal laceration that was repaired.  The patient was  watched post partum secondary to a history of chronic hypertension with  some elevating blood pressures.  PIH labs were obtained and the patient  was started on some Procardia 30 mg, which was increased to 60 by  postpartum day #1.  The patient's liver function and enzyme started to  elevate.  The rest of a PIH labs returned within normal limits.  She was  continued on her Procardia 60 mg, was also started on labetalol 300 mg  every 8 hours on postpartum day #3.  Maternal fetal medicine was  consulted.  Secondary to this elevation in liver function tests, her  blood pressure was well controlled and preeclampsia precautions were  reviewed.  The patient was felt ready for discharge on postpartum day  #5.  Her blood pressures were stable.  She was having no other signs of  preeclampsia and she was felt ready for discharge.  The patient was sent  home on Procardia 60 mg daily as well as labetalol 300 mg t.i.d.  She  was to recheck her blood pressure in our office in 1 week as well as  have a repeat of her liver function test and CBC.  Precautions for preeclampsia and hypertension were given to the patient.  Upon discharge, the patient had a hemoglobin of 10.4, white blood cell  count of 6.7, and platelets of 162,000.  The patient's liver enzymes  were last checked on April 21, 2009, and were stabilizing.      Leilani Able, P.A.-C.      Randye Lobo, M.D.  Electronically Signed    MB/MEDQ  D:  05/09/2009  T:  05/09/2009  Job:  [id number]

## 2011-06-01 ENCOUNTER — Encounter: Payer: BC Managed Care – PPO | Admitting: Pulmonary Disease

## 2011-07-07 ENCOUNTER — Encounter: Payer: Self-pay | Admitting: Pulmonary Disease

## 2011-07-07 ENCOUNTER — Ambulatory Visit (INDEPENDENT_AMBULATORY_CARE_PROVIDER_SITE_OTHER): Payer: BC Managed Care – PPO | Admitting: Pulmonary Disease

## 2011-07-07 VITALS — BP 110/68 | HR 82 | Temp 98.4°F | Ht 65.0 in | Wt 187.2 lb

## 2011-07-07 DIAGNOSIS — K219 Gastro-esophageal reflux disease without esophagitis: Secondary | ICD-10-CM

## 2011-07-07 DIAGNOSIS — M4120 Other idiopathic scoliosis, site unspecified: Secondary | ICD-10-CM

## 2011-07-07 DIAGNOSIS — Z8719 Personal history of other diseases of the digestive system: Secondary | ICD-10-CM

## 2011-07-07 DIAGNOSIS — E663 Overweight: Secondary | ICD-10-CM

## 2011-07-07 DIAGNOSIS — F411 Generalized anxiety disorder: Secondary | ICD-10-CM

## 2011-07-07 DIAGNOSIS — Z0000 Encounter for general adult medical examination without abnormal findings: Secondary | ICD-10-CM

## 2011-07-07 DIAGNOSIS — I10 Essential (primary) hypertension: Secondary | ICD-10-CM

## 2011-07-07 MED ORDER — DILTIAZEM HCL ER COATED BEADS 240 MG PO CP24: 240.0000 mg | ORAL_CAPSULE | Freq: Every day | ORAL | Status: DC

## 2011-07-07 MED ORDER — LISINOPRIL-HYDROCHLOROTHIAZIDE 20-12.5 MG PO TABS: 1.0000 | ORAL_TABLET | Freq: Every day | ORAL | Status: DC

## 2011-07-07 MED ORDER — ALPRAZOLAM 0.25 MG PO TABS: 0.2500 mg | ORAL_TABLET | Freq: Three times a day (TID) | ORAL | Status: DC | PRN

## 2011-07-07 NOTE — Patient Instructions (Signed)
Today we updated your med list in EPIC...    We refilled your medications today...  We reviewed your previous CXR & blood work... Your EKG today looks fine...  Let's get on track w/ our diet & exercise program- the goal is to lose 10-15 lbs...  Call for any problems...  Let's plan a follow up visit w/ XRay & FASTING blood work next year.Marland KitchenMarland Kitchen

## 2011-07-07 NOTE — Progress Notes (Signed)
Subjective:    Patient ID: Karina Hawkins, female    DOB: 1966-02-05, 45 y.o.   MRN: [id number]  HPI 45 y/o WF here for a follow up visit and CPX...   ~  July 07, 2011:  54mo ROV & CPX> good general health, c/o some headaches and anxiety...    HBP> on Diltiazem & Lisinopril/HCT; BP= 102/68 & closer to 120/80 at home; feeling well, energy is good, w/o visual symptoms, CP, palpit, SOB, edema, etc...    HYx CP> neg cardiac eval in 2007; remains active, no chest discomfort etc...    Overweight> weight is essentially stable at 187# today; we reviewed diet & exercise plan needed fpor weight reduction...    GERD> she uses OTC meds as needed; denies active reflux, heartburn, dysphagia, etc...    IBS> she denies recent symptoms w/o abd pain, D/ C/ blood seen...    Gallstone> seen on sonar 1995; she remains asymptomatic w/o N/ V/ discomfort/ etc...    Scoliosis> seen on old XRays; she denies back pain, stiffness, etc...    Anxiety> this appears to be her main issue> hx panic attack w/ chest tightness, tingling in arms, anxiety & tension HAs; Alprazolam 0.25mg  1/2 to 1 tab works well for her & we reviewed this...   Current Problems:   PHYSICAL EXAMINATION (ICD-V70.0) -  She had TDAP 10/10, & rec to get the yearly Flu vaccine...  Hx of PNEUMONIA (ICD-486) - this occured in 2000 & resolved w/ antibiotic Rx.  HYPERTENSION (ICD-401.9) - on CARTIA XT 240mg /d & LISINOPRIL/ HCT 20-12.5 daily... takes meds regularly & tolerates well...  denies HA, fatigue, visual changes, CP, palipit, dizziness, syncope, dyspnea, edema, etc...   Hx of CHEST PAIN (ICD-786.50) - s/p cardiac eval 2007 by DrBensimhon >> normal 2DEcho & norm Myoview w/ EF= 72%... also neg holter monitor- all NSR, no arrhythmias... baseline EKG w/ NSR, WNL.Marland KitchenMarland Kitchen  OVERWEIGHT (ICD-278.02) - discussed diet + exercise program... ~  weight 8/08 = 175# ~  weight 1/11 = 191# ~  Weight 10/12 = 187#  ESOPHAGEAL REFLUX (ICD-530.81) - hx reflux symptoms  controlled w/ OTC PPI & H2blockers... she had EGD's in '85 & '90 which showed mild GERD, otherw neg.  IRRITABLE BOWEL SYNDROME, HX OF (ICD-V12.79) - denies current symptoms... hx prev FlexSig 1990 that was neg...  ? of GALLSTONES (ICD-574.20) - Sonar 1995 showed sm gallstone... no symptoms & denies abd pain, N/ V/ etc...  ? of DISORDER, ADRENAL GLAND (ICD-255.9) - incidental left adrenal adenoma seen on CT Abd in 2007  Hx of SCOLIOSIS (ICD-737.30) - seen on XRays in 1980's... she denies back pain, stiffness, etc...   Hx of DISORDER OF BONE AND CARTILAGE UNSPECIFIED (ICD-733.90) - prev XRAys also revealed a ?cystic lesion in left pubic ramus- she denies pelvic pain, etc...  ANXIETY & PANIC DISORDER>  uses ALPRAZOLAM 0.5mg  1/2 to 1 tab  Tid Prn...  DERM>  Sebaceous cyst on back...    Past Surgical History  Procedure Date  . Tonsillectomy Age 35    w/ Adenoids  . Anal fissure repair 1993    Dr Earlene Plater    Outpatient Encounter Prescriptions as of 07/07/2011  Medication Sig Dispense Refill  . ALPRAZolam (XANAX) 0.25 MG tablet Take 0.25 mg by mouth 3 (three) times daily as needed.       . diltiazem (CARDIZEM CD) 240 MG 24 hr capsule Take 240 mg by mouth daily.       Marland Kitchen lisinopril-hydrochlorothiazide (PRINZIDE,ZESTORETIC) 20-12.5 MG per tablet  Take 1 tablet by mouth daily.       . Multiple Vitamin (MULTIVITAMIN) tablet Take 1 tablet by mouth daily.        Marland Kitchen DISCONTD: meclizine (ANTIVERT) 25 MG tablet Take 1 tablet (25 mg total) by mouth 3 (three) times daily as needed for dizziness or nausea.  30 tablet  1    Allergies  Allergen Reactions  . Penicillins     Throat closes up  . Biaxin     Lots of nausea    Current Medications, Allergies, Past Medical History, Past Surgical History, Family History, and Social History were reviewed in [employer] record.    Review of Systems    The patient denies fever, chills, sweats, anorexia, fatigue, weakness, malaise,  weight loss, sleep disorder, blurring, diplopia, eye irritation, eye discharge, vision loss, eye pain, photophobia, earache, ear discharge, tinnitus, decreased hearing, nasal congestion, nosebleeds, sore throat, hoarseness, chest pain, palpitations, syncope, dyspnea on exertion, orthopnea, PND, peripheral edema, cough, dyspnea at rest, excessive sputum, hemoptysis, wheezing, pleurisy, nausea, vomiting, diarrhea, constipation, change in bowel habits, abdominal pain, melena, hematochezia, jaundice, gas/bloating, indigestion/heartburn, dysphagia, odynophagia, dysuria, hematuria, urinary frequency, urinary hesitancy, nocturia, incontinence, back pain, joint pain, joint swelling, muscle cramps, muscle weakness, stiffness, arthritis, sciatica, restless legs, leg pain at night, leg pain with exertion, rash, itching, dryness, suspicious lesions, paralysis, paresthesias, seizures, tremors, vertigo, transient blindness, frequent falls, frequent headaches, difficulty walking, depression, anxiety, memory loss, confusion, cold intolerance, heat intolerance, polydipsia, polyphagia, polyuria, unusual weight change, abnormal bruising, bleeding, enlarged lymph nodes, urticaria, allergic rash, hay fever, and recurrent infections.     Objective:   Physical Exam     WD, Overweight, 45 y/o WF in NAD... GENERAL:  Alert & oriented; pleasant & cooperative... HEENT:  [place]/AT, EOM-wnl, PERRLA, Fundi-benign, EACs-clear, TMs-wnl, NOSE-clear, THROAT-clear & wnl. NECK:  Supple w/ full ROM; no JVD; normal carotid impulses w/o bruits; no thyromegaly or nodules palpated; no lymphadenopathy. CHEST:  Clear to P & A; without wheezes/ rales/ or rhonchi. HEART:  Regular Rhythm; without murmurs/ rubs/ or gallops. ABDOMEN:  Soft & nontender; normal bowel sounds; no organomegaly or masses detected. EXT: without deformities or arthritic changes; no varicose veins/ venous insuffic/ or edema. NEURO:  CN's intact; motor testing normal; sensory  testing normal; gait normal & balance OK. DERM:  Sebaceous cyst on back...  RADIOLOGY DATA:  Reviewed in the EPIC EMR & discussed w/ the patient...  LABORATORY DATA:  Reviewed in the EPIC EMR & discussed w/ the patient...   Assessment & Plan:   Physical Exam>  Good general health; we reviewed diet, exercise, BP meds and Alpraz for nerves...  HBP>  Controlled on Diltiazem & Lisinopril/ HCT; continue same...  Hx CP>  No recurrent symptoms; she had some SOB related to anxiety...  Overweight>  Stable at 187#; we discussed diet + exercise needed to lose wt...  GI>  Hx reflux, IBS, Gallstone> all quiescent & asymptomatic, no requiring meds...  Ortho>  Hx scoliosis, ?cyst in pubic ramus> she is asymptomatic & doing well...  Anxiety>  She's had what sounds like panic attacks; rec to use Alpraz more regularly as discussed.Marland KitchenMarland Kitchen

## 2011-07-13 ENCOUNTER — Other Ambulatory Visit: Payer: Self-pay | Admitting: Obstetrics & Gynecology

## 2011-07-16 ENCOUNTER — Other Ambulatory Visit: Payer: Self-pay | Admitting: Obstetrics & Gynecology

## 2011-07-16 DIAGNOSIS — R928 Other abnormal and inconclusive findings on diagnostic imaging of breast: Secondary | ICD-10-CM

## 2011-07-18 ENCOUNTER — Encounter: Payer: Self-pay | Admitting: Pulmonary Disease

## 2011-07-21 ENCOUNTER — Other Ambulatory Visit: Payer: Self-pay | Admitting: Adult Health

## 2011-07-22 ENCOUNTER — Other Ambulatory Visit: Payer: Self-pay | Admitting: Pulmonary Disease

## 2011-07-29 ENCOUNTER — Ambulatory Visit
Admission: RE | Admit: 2011-07-29 | Discharge: 2011-07-29 | Disposition: A | Discharge status: Home / Self Care | Payer: BC Managed Care – PPO | Source: Ambulatory Visit | Attending: Obstetrics & Gynecology | Admitting: Obstetrics & Gynecology

## 2011-07-29 DIAGNOSIS — R928 Other abnormal and inconclusive findings on diagnostic imaging of breast: Secondary | ICD-10-CM

## 2011-12-17 ENCOUNTER — Other Ambulatory Visit (INDEPENDENT_AMBULATORY_CARE_PROVIDER_SITE_OTHER): Payer: BC Managed Care – PPO

## 2011-12-17 ENCOUNTER — Ambulatory Visit (HOSPITAL_COMMUNITY)
Admission: RE | Admit: 2011-12-17 | Discharge: 2011-12-17 | Disposition: A | Discharge status: Home / Self Care | Payer: BC Managed Care – PPO | Source: Ambulatory Visit | Attending: Adult Health | Admitting: Adult Health

## 2011-12-17 ENCOUNTER — Encounter: Payer: Self-pay | Admitting: Adult Health

## 2011-12-17 ENCOUNTER — Ambulatory Visit (INDEPENDENT_AMBULATORY_CARE_PROVIDER_SITE_OTHER): Payer: BC Managed Care – PPO | Admitting: Adult Health

## 2011-12-17 VITALS — BP 122/80 | HR 77 | Temp 97.4°F | Ht 65.0 in | Wt 186.2 lb

## 2011-12-17 DIAGNOSIS — R51 Headache: Secondary | ICD-10-CM | POA: Insufficient documentation

## 2011-12-17 DIAGNOSIS — R42 Dizziness and giddiness: Secondary | ICD-10-CM | POA: Insufficient documentation

## 2011-12-17 DIAGNOSIS — R5381 Other malaise: Secondary | ICD-10-CM | POA: Insufficient documentation

## 2011-12-17 DIAGNOSIS — R5383 Other fatigue: Secondary | ICD-10-CM | POA: Insufficient documentation

## 2011-12-17 LAB — BASIC METABOLIC PANEL WITH GFR
BUN: 15 mg/dL (ref 6–23)
CO2: 30 meq/L (ref 19–32)
Calcium: 9.2 mg/dL (ref 8.4–10.5)
Chloride: 103 meq/L (ref 96–112)
Creatinine, Ser: 0.6 mg/dL (ref 0.4–1.2)
GFR: 112.83 mL/min
Glucose, Bld: 93 mg/dL (ref 70–99)
Potassium: 4.2 meq/L (ref 3.5–5.1)
Sodium: 139 meq/L (ref 135–145)

## 2011-12-17 LAB — CBC WITH DIFFERENTIAL/PLATELET
Basophils Relative: 0.4 % (ref 0.0–3.0)
Eosinophils Relative: 0.5 % (ref 0.0–5.0)
HCT: 39.1 % (ref 36.0–46.0)
Lymphs Abs: 1.5 10*3/uL (ref 0.7–4.0)
MCV: 83.4 fl (ref 78.0–100.0)
Monocytes Absolute: 0.4 10*3/uL (ref 0.1–1.0)
Monocytes Relative: 6.2 % (ref 3.0–12.0)
Neutrophils Relative %: 68.7 % (ref 43.0–77.0)
RBC: 4.69 Mil/uL (ref 3.87–5.11)
WBC: 6.3 10*3/uL (ref 4.5–10.5)

## 2011-12-17 LAB — HCG, QUANTITATIVE, PREGNANCY: hCG, Beta Chain, Quant, S: 0.92 m[IU]/mL

## 2011-12-17 MED ORDER — MECLIZINE HCL 25 MG PO TABS: 25.0000 mg | ORAL_TABLET | Freq: Three times a day (TID) | ORAL | Status: DC | PRN

## 2011-12-17 NOTE — Patient Instructions (Signed)
Saline nasal rinses As needed   Claritin daily As needed  Drainage.  Meclizine 25mg  Three times a day  As needed  Dizziness.  I will call with labs and CT results.  Change positions slowly and drink plenty of fluids Please contact office for sooner follow up if symptoms do not improve or worsen or seek emergency care  follow up. Dr. Kriste Basque  As planned and As needed

## 2011-12-17 NOTE — Assessment & Plan Note (Signed)
?  recurrent Vertigo w/ atypical symptoms of headache and sinus symptoms  Will check labs and CT head   Plan:  Saline nasal rinses As needed   Claritin daily As needed  Drainage.  Meclizine 25mg  Three times a day  As needed  Dizziness.    Change positions slowly and drink plenty of fluids Please contact office for sooner follow up if symptoms do not improve or worsen or seek emergency care  follow up. Dr. Kriste Basque  As planned and As needed

## 2011-12-17 NOTE — Progress Notes (Signed)
Subjective:    Patient ID: Karina Hawkins, female    DOB: 03-06-1966, 46 y.o.   MRN: [id number]  HPI  46 y/o WF   ~  July 07, 2011:  55mo ROV & CPX> good general health, c/o some headaches and anxiety...    HBP> on Diltiazem & Lisinopril/HCT; BP= 102/68 & closer to 120/80 at home; feeling well, energy is good, w/o visual symptoms, CP, palpit, SOB, edema, etc...    HYx CP> neg cardiac eval in 2007; remains active, no chest discomfort etc...    Overweight> weight is essentially stable at 187# today; we reviewed diet & exercise plan needed fpor weight reduction...    GERD> she uses OTC meds as needed; denies active reflux, heartburn, dysphagia, etc...    IBS> she denies recent symptoms w/o abd pain, D/ C/ blood seen...    Gallstone> seen on sonar 1995; she remains asymptomatic w/o N/ V/ discomfort/ etc...    Scoliosis> seen on old XRays; she denies back pain, stiffness, etc...    Anxiety> this appears to be her main issue> hx panic attack w/ chest tightness, tingling in arms, anxiety & tension HAs; Alprazolam 0.25mg  1/2 to 1 tab works well for her & we reviewed this...   12/17/2011 Acute OV  Complains of dizziness x3days - some head congestion with HA Occurred 3 days ago shortly after taking b/p meds . Over last few days with nasal congestion, drainage, sinus pressure. Feels lightheaded, dizzy, tired, headache. No fever , visual/speech changes. Has taken BC powder, sinus /cold meds. Today headache and dizziness better. But had severe episode of dizziness, weakness. Checked b/p today at 136/80. Felt better after eating.  Today on arrival with blood sugar 77.       Current Problems:   PHYSICAL EXAMINATION (ICD-V70.0) -  She had TDAP 10/10, & rec to get the yearly Flu vaccine...  Hx of PNEUMONIA (ICD-486) - this occured in 2000 & resolved w/ antibiotic Rx.  HYPERTENSION (ICD-401.9) - on CARTIA XT 240mg /d & LISINOPRIL/ HCT 20-12.5 daily... takes meds regularly & tolerates well...  denies  HA, fatigue, visual changes, CP, palipit, dizziness, syncope, dyspnea, edema, etc...   Hx of CHEST PAIN (ICD-786.50) - s/p cardiac eval 2007 by DrBensimhon >> normal 2DEcho & norm Myoview w/ EF= 72%... also neg holter monitor- all NSR, no arrhythmias... baseline EKG w/ NSR, WNL.Marland KitchenMarland Kitchen  OVERWEIGHT (ICD-278.02) - discussed diet + exercise program... ~  weight 8/08 = 175# ~  weight 1/11 = 191# ~  Weight 10/12 = 187#  ESOPHAGEAL REFLUX (ICD-530.81) - hx reflux symptoms controlled w/ OTC PPI & H2blockers... she had EGD's in '85 & '90 which showed mild GERD, otherw neg.  IRRITABLE BOWEL SYNDROME, HX OF (ICD-V12.79) - denies current symptoms... hx prev FlexSig 1990 that was neg...  ? of GALLSTONES (ICD-574.20) - Sonar 1995 showed sm gallstone... no symptoms & denies abd pain, N/ V/ etc...  ? of DISORDER, ADRENAL GLAND (ICD-255.9) - incidental left adrenal adenoma seen on CT Abd in 2007  Hx of SCOLIOSIS (ICD-737.30) - seen on XRays in 1980's... she denies back pain, stiffness, etc...   Hx of DISORDER OF BONE AND CARTILAGE UNSPECIFIED (ICD-733.90) - prev XRAys also revealed a ?cystic lesion in left pubic ramus- she denies pelvic pain, etc...  ANXIETY & PANIC DISORDER>  uses ALPRAZOLAM 0.5mg  1/2 to 1 tab  Tid Prn...  DERM>  Sebaceous cyst on back...    Past Surgical History  Procedure Date  . Tonsillectomy Age 18  w/ Adenoids  . Anal fissure repair 1993    Dr Earlene Plater    Outpatient Encounter Prescriptions as of 12/17/2011  Medication Sig Dispense Refill  . ALPRAZolam (XANAX) 0.25 MG tablet Take 1 tablet (0.25 mg total) by mouth 3 (three) times daily as needed.  90 tablet  5  . diltiazem (CARDIZEM CD) 240 MG 24 hr capsule EVERY DAY  90 capsule  2  . lisinopril-hydrochlorothiazide (PRINZIDE,ZESTORETIC) 20-12.5 MG per tablet TAKE 1 TABLET EVERY DAY  90 tablet  2  . Multiple Vitamin (MULTIVITAMIN) tablet Take 1 tablet by mouth daily.          Allergies  Allergen Reactions  . Penicillins      Throat closes up  . Biaxin     Lots of nausea    Current Medications, Allergies, Past Medical History, Past Surgical History, Family History, and Social History were reviewed in [employer] record.    Review of Systems Constitutional:   No  weight loss, night sweats,  Fevers, chills,  +fatigue, or  lassitude.  HEENT:   No headaches,  Difficulty swallowing,  Tooth/dental problems, or  Sore throat,                No sneezing, itching, ear ache,  =nasal congestion, post nasal drip,   CV:  No chest pain,  Orthopnea, PND, swelling in lower extremities, anasarca, dizziness, palpitations, syncope.   GI  No heartburn, indigestion, abdominal pain, nausea, vomiting, diarrhea, change in bowel habits, loss of appetite, bloody stools.   Resp: No shortness of breath with exertion or at rest.  No excess mucus, no productive cough,  No non-productive cough,  No coughing up of blood.  No change in color of mucus.  No wheezing.  No chest wall deformity  Skin: no rash or lesions.  GU: no dysuria, change in color of urine, no urgency or frequency.  No flank pain, no hematuria   MS:  No joint pain or swelling.  No decreased range of motion.  No back pain.  Psych:  No change in mood or affect. No depression or anxiety.  No memory loss.           Objective:   Physical Exam      WD, Overweight, 46 y/o WF in NAD... GENERAL:  Alert & oriented; pleasant & cooperative... HEENT:  [place]/AT, EOM-wnl, PERRLA, Fundi-benign, EACs-clear, TMs-wnl, NOSE-clear, THROAT-clear & wnl. NECK:  Supple w/ full ROM; no JVD; normal carotid impulses w/o bruits; no thyromegaly or nodules palpated; no lymphadenopathy. CHEST:  Clear to P & A; without wheezes/ rales/ or rhonchi. HEART:  Regular Rhythm; without murmurs/ rubs/ or gallops. ABDOMEN:  Soft & nontender; normal bowel sounds; no organomegaly or masses detected. EXT: without deformities or arthritic changes; no varicose veins/ venous insuffic/  or edema. NEURO:  CN's intact; motor testing normal; sensory testing normal; gait normal & balance OK. DERM:  Clear      Assessment & Plan:

## 2011-12-20 ENCOUNTER — Telehealth: Payer: Self-pay | Admitting: Pulmonary Disease

## 2011-12-20 NOTE — Progress Notes (Signed)
Quick Note:  Pt aware of results and no questions/concerns at this time. Pt did state she is feeling better by following the recs from TP at the OV. ______

## 2011-12-20 NOTE — Telephone Encounter (Signed)
Notes Recorded by Julio Sicks, NP on 12/19/2011 at 11:27 AM No sign of acute process Nothing to explain symptoms  If continues to have symptoms call for follow up ov or ER  Please contact office for sooner follow up if symptoms do not improve or worsen or seek emergency care  [facility]   Pt aware of results and no questions/concerns at this time. Pt did state she is feeling better by following the recs from TP at the OV.

## 2011-12-21 ENCOUNTER — Other Ambulatory Visit: Payer: BC Managed Care – PPO

## 2012-03-20 ENCOUNTER — Telehealth: Payer: Self-pay | Admitting: Family Medicine

## 2012-03-20 NOTE — Telephone Encounter (Signed)
Patients mother is a patient here and stated she spoke wt/dr.lowne about seeing her daughter Karina Hawkins. I have made her a NPE for 8.2.13 1st available appt. We had. Patient's mother states patient has a knot on her back that is painful to the touch, can she be seen for this acute condition prior to her NPE appt? Please advise and I can call Karina Hawkins back with information, ph# [phone]

## 2012-03-20 NOTE — Telephone Encounter (Signed)
Called lmom to come in Friday at 815am, and advised we could only discuss this acute problem and she would need to come back in August for a complete New Patient eval.

## 2012-03-20 NOTE — Telephone Encounter (Signed)
Yes you can schedule her one day this week for the acute and she can keep the NPE apt in august.      KP

## 2012-03-24 ENCOUNTER — Ambulatory Visit (INDEPENDENT_AMBULATORY_CARE_PROVIDER_SITE_OTHER): Payer: BC Managed Care – PPO | Admitting: Family Medicine

## 2012-03-24 ENCOUNTER — Encounter: Payer: Self-pay | Admitting: Family Medicine

## 2012-03-24 VITALS — BP 122/80 | HR 80 | Temp 98.5°F | Ht 64.25 in | Wt 187.2 lb

## 2012-03-24 DIAGNOSIS — L723 Sebaceous cyst: Secondary | ICD-10-CM

## 2012-03-24 NOTE — Assessment & Plan Note (Signed)
Refer to surgery for evaluation and to have it removed

## 2012-03-24 NOTE — Progress Notes (Signed)
  Subjective:    Patient ID: Karina Hawkins, female    DOB: [birth year], 46 y.o.   MRN: [id number]  HPI Pt here c/o "knot" on back that is getting bigger and is tender to touch.  It s been there for several years.   No other symptoms , complaints.    Review of Systems as above   Objective:   Physical Exam  Constitutional: She is oriented to person, place, and time. She appears well-developed and well-nourished.  Neurological: She is alert and oriented to person, place, and time.  Skin:       + sebaceous cyst --- between shoulder blades ,  No signs infection , no draining  Psychiatric: She has a normal mood and affect. Her behavior is normal. Judgment and thought content normal.          Assessment & Plan:

## 2012-03-24 NOTE — Patient Instructions (Signed)

## 2012-04-25 ENCOUNTER — Ambulatory Visit (INDEPENDENT_AMBULATORY_CARE_PROVIDER_SITE_OTHER): Payer: BC Managed Care – PPO | Admitting: Surgery

## 2012-04-25 ENCOUNTER — Encounter (INDEPENDENT_AMBULATORY_CARE_PROVIDER_SITE_OTHER): Payer: Self-pay | Admitting: Surgery

## 2012-04-25 VITALS — BP 128/64 | HR 66 | Temp 97.8°F | Resp 12 | Ht 65.0 in | Wt 185.2 lb

## 2012-04-25 DIAGNOSIS — L723 Sebaceous cyst: Secondary | ICD-10-CM

## 2012-04-25 NOTE — Patient Instructions (Addendum)
See the Handout(s) we gave you.  Consider surgery to remove your skin cyst.  Please call our office at [phone] if you wish to schedule surgery or if you have further questions / concerns.   Epidermal Cyst An epidermal cyst is sometimes called a sebaceous cyst, epidermal inclusion cyst, or infundibular cyst. These cysts usually contain a substance that looks "pasty" or "cheesy" and may have a bad smell. This substance is a protein called keratin. Epidermal cysts are usually found on the face, neck, or trunk. They may also occur in the vaginal area or other parts of the genitalia of both men and women. Epidermal cysts are usually small, painless, slow-growing bumps or lumps that move freely under the skin. It is important not to try to pop them. This may cause an infection and lead to tenderness and swelling. CAUSES  Epidermal cysts may be caused by a deep penetrating injury to the skin or a plugged hair follicle, often associated with acne. SYMPTOMS  Epidermal cysts can become inflamed and cause:  Redness.   Tenderness.   Increased temperature of the skin over the bumps or lumps.   Grayish-white, bad smelling material that drains from the bump or lump.  DIAGNOSIS  Epidermal cysts are easily diagnosed by your caregiver during an exam. Rarely, a tissue sample (biopsy) may be taken to rule out other conditions that may resemble epidermal cysts. TREATMENT   Epidermal cysts often get better and disappear on their own. They are rarely ever cancerous.   If a cyst becomes infected, it may become inflamed and tender. This may require opening and draining the cyst. Treatment with antibiotics may be necessary. When the infection is gone, the cyst may be removed with minor surgery.   Small, inflamed cysts can often be treated with antibiotics or by injecting steroid medicines.   Sometimes, epidermal cysts become large and bothersome. If this happens, surgical removal in your caregiver's office  may be necessary.  HOME CARE INSTRUCTIONS  Only take over-the-counter or prescription medicines as directed by your caregiver.   Take your antibiotics as directed. Finish them even if you start to feel better.  SEEK MEDICAL CARE IF:   Your cyst becomes tender, red, or swollen.   Your condition is not improving or is getting worse.   You have any other questions or concerns.  MAKE SURE YOU:  Understand these instructions.   Will watch your condition.   Will get help right away if you are not doing well or get worse.  Document Released: 08/21/2004 Document Revised: 09/09/2011 Document Reviewed: 03/29/2011 [facility] Patient Information 2012 [place], [place].

## 2012-04-25 NOTE — Progress Notes (Signed)
Subjective:     Patient ID: Karina Hawkins, female   DOB: 02-13-1966, 46 y.o.   MRN: [id number]  HPI  Karina Hawkins  08/29/1966 [id number]  Patient Care Team: Lelon Perla, DO as PCP - General (Family Medicine)  This patient is a 46 y.o.female who presents today for surgical evaluation at the request of Dr. Laury Axon.   Reason for evaluation: Swelling on upper back.  Question of sebaceous cyst.  Patient is a pleasant female with no history of prior skin problems or infections.  No MRSA.  Two years ago she noticed a lump along the spine in between her shoulder blades.  It had not been particularly bothered bothersome.  However, she months ago it started to get more swollen.  She tried to squeeze it to open up.  Did not succeed.  Friends or at about possible cancer a connection to the spine.  More sore with exercise and physical activity.  Based on concerns she saw her primary care physician.  Dr. Laury Axon sent the patient to [place] to consider removal.  Since the visit with her primary care physician, the swelling has gone down.  Still sensitive and tender.  Never has drained.  No fevers or chills.  Patient Active Problem List  Diagnosis  . Obesity (BMI 30-39.9)  . ANXIETY  . HYPERTENSION  . Vertigo  . Sebaceous cyst, mid thoracic back    Past Medical History  Diagnosis Date  . Pneumonia   . HTN (hypertension)   . Overweight   . Esophageal reflux   . Anxiety     Past Surgical History  Procedure Date  . Tonsillectomy Age 46    w/ Adenoids  . Anal fissure repair 1993    Dr Earlene Plater  . Tubal ligation   . Foot surgery 2002    bunion repair    History   Social History  . Marital Status: Married    Spouse Name: Lewis    Number of Children: 2  . Years of Education: N/A   Occupational History  . Not on file.   Social History Main Topics  . Smoking status: Never Smoker   . Smokeless tobacco: Never Used  . Alcohol Use: 0.0 oz/week     socially 1x per month  . Drug Use: No  .  Sexually Active: Not on file   Other Topics Concern  . Not on file   Social History Narrative  . No narrative on file    Family History  Problem Relation Age of Onset  . Diabetes Mother   . Hypertension Mother   . Hyperlipidemia Mother   . Rheum arthritis Mother   . Stroke Mother   . Hypertension Father   . Hyperlipidemia Father   . Breast cancer Maternal Grandmother   . Hypertension Maternal Grandmother   . Emphysema Maternal Grandfather   . Hypertension Maternal Grandfather   . Hypertension Paternal Grandmother   . Hypertension Paternal Grandfather     Current Outpatient Prescriptions  Medication Sig Dispense Refill  . ALPRAZolam (XANAX) 0.25 MG tablet Take 1 tablet (0.25 mg total) by mouth 3 (three) times daily as needed.  90 tablet  5  . diltiazem (CARDIZEM CD) 240 MG 24 hr capsule EVERY DAY  90 capsule  2  . lisinopril-hydrochlorothiazide (PRINZIDE,ZESTORETIC) 20-12.5 MG per tablet TAKE 1 TABLET EVERY DAY  90 tablet  2  . Multiple Vitamin (MULTIVITAMIN) tablet Take 1 tablet by mouth daily.        Marland Kitchen  meclizine (ANTIVERT) 25 MG tablet Take 1 tablet (25 mg total) by mouth 3 (three) times daily as needed for dizziness or nausea.  30 tablet  1     Allergies  Allergen Reactions  . Penicillins     Throat closes up  . Clarithromycin     Lots of nausea    BP 128/64  Pulse 66  Temp 97.8 F (36.6 C) (Temporal)  Resp 12  Ht 5\' 5"  (1.651 m)  Wt 185 lb 3.2 oz (84.006 kg)  BMI 30.82 kg/m2  LMP 04/08/2012  No results found.   Review of Systems  Constitutional: Negative for fever, chills, diaphoresis, appetite change and fatigue.  HENT: Negative for ear pain, sore throat, trouble swallowing, neck pain and ear discharge.   Eyes: Negative for photophobia, discharge and visual disturbance.  Respiratory: Negative for cough, choking, chest tightness and shortness of breath.   Cardiovascular: Negative for chest pain and palpitations.  Gastrointestinal: Negative for nausea,  vomiting, abdominal pain, diarrhea, constipation, anal bleeding and rectal pain.  Genitourinary: Negative for dysuria, frequency and difficulty urinating.  Musculoskeletal: Negative for myalgias and gait problem.  Skin: Negative for color change, pallor, rash and wound.  Neurological: Negative for dizziness, speech difficulty, weakness and numbness.  Hematological: Negative for adenopathy.  Psychiatric/Behavioral: Negative for confusion and agitation. The patient is not nervous/anxious.        Objective:   Physical Exam  Constitutional: She is oriented to person, place, and time. She appears well-developed and well-nourished. No distress.  HENT:  Head: Normocephalic.  Mouth/Throat: Oropharynx is clear and moist. No oropharyngeal exudate.  Eyes: Conjunctivae and EOM are normal. Pupils are equal, round, and reactive to light. No scleral icterus.  Neck: Normal range of motion. Neck supple. No tracheal deviation present.  Cardiovascular: Normal rate, regular rhythm and intact distal pulses.   Pulmonary/Chest: Effort normal and breath sounds normal. No respiratory distress. She exhibits no tenderness.  Abdominal: Soft. She exhibits no distension and no mass. There is no tenderness. Hernia confirmed negative in the right inguinal area and confirmed negative in the left inguinal area.  Genitourinary: No vaginal discharge found.  Musculoskeletal: Normal range of motion. She exhibits no tenderness.  Lymphadenopathy:    She has no cervical adenopathy.       Right: No inguinal adenopathy present.       Left: No inguinal adenopathy present.  Neurological: She is alert and oriented to person, place, and time. No cranial nerve deficit. She exhibits normal muscle tone. Coordination normal.  Skin: Skin is warm and dry. No rash noted. She is not diaphoretic. No erythema.     Psychiatric: She has a normal mood and affect. Her behavior is normal. Judgment and thought content normal.       Assessment:      Sebaceous cyst on mid thoracic back w intermittent inflammation    Plan:     Removal.  I think it is small & superficial enough to do in a minor OR room in our office/outpt center:  The pathophysiology of skin & subcutaneous masses was discussed.  Natural history risks without surgery were discussed.  I recommended surgery to remove the mass.  I explained the technique of removal with use of local anesthesia & possible need for more aggressive sedation/anesthesia for patient comfort.    Risks such as bleeding, infection, heart attack, death, and other risks were discussed.  I noted a good likelihood this will help address the problem.   Possibility that this  will not correct all symptoms was explained. Possibility of regrowth/recurrence of the mass was discussed.  We will work to minimize complications. Questions were answered.  The patient expresses understanding & wishes to proceed with surgery.

## 2012-05-05 ENCOUNTER — Ambulatory Visit (INDEPENDENT_AMBULATORY_CARE_PROVIDER_SITE_OTHER): Payer: BC Managed Care – PPO | Admitting: Family Medicine

## 2012-05-05 ENCOUNTER — Encounter: Payer: Self-pay | Admitting: Family Medicine

## 2012-05-05 VITALS — BP 122/70 | HR 72 | Temp 97.0°F | Ht 64.25 in | Wt 185.2 lb

## 2012-05-05 DIAGNOSIS — Z0000 Encounter for general adult medical examination without abnormal findings: Secondary | ICD-10-CM

## 2012-05-05 DIAGNOSIS — I10 Essential (primary) hypertension: Secondary | ICD-10-CM

## 2012-05-05 DIAGNOSIS — N390 Urinary tract infection, site not specified: Secondary | ICD-10-CM

## 2012-05-05 LAB — LIPID PANEL
Total CHOL/HDL Ratio: 3
Triglycerides: 62 mg/dL (ref 0.0–149.0)

## 2012-05-05 LAB — POCT URINALYSIS DIPSTICK
Bilirubin, UA: NEGATIVE
Blood, UA: NEGATIVE
Glucose, UA: NEGATIVE
Ketones, UA: NEGATIVE
Nitrite, UA: NEGATIVE

## 2012-05-05 LAB — CBC WITH DIFFERENTIAL/PLATELET
Basophils Relative: 0.2 % (ref 0.0–3.0)
Eosinophils Absolute: 0.1 10*3/uL (ref 0.0–0.7)
Eosinophils Relative: 1 % (ref 0.0–5.0)
HCT: 38.8 % (ref 36.0–46.0)
Hemoglobin: 12.9 g/dL (ref 12.0–15.0)
MCHC: 33.3 g/dL (ref 30.0–36.0)
MCV: 85.1 fl (ref 78.0–100.0)
Monocytes Absolute: 0.3 10*3/uL (ref 0.1–1.0)
Neutro Abs: 3.7 10*3/uL (ref 1.4–7.7)
Neutrophils Relative %: 65.9 % (ref 43.0–77.0)
RBC: 4.55 Mil/uL (ref 3.87–5.11)
WBC: 5.7 10*3/uL (ref 4.5–10.5)

## 2012-05-05 LAB — BASIC METABOLIC PANEL
CO2: 30 mEq/L (ref 19–32)
Chloride: 102 mEq/L (ref 96–112)
Creatinine, Ser: 0.6 mg/dL (ref 0.4–1.2)
Potassium: 4.1 mEq/L (ref 3.5–5.1)

## 2012-05-05 LAB — HEPATIC FUNCTION PANEL
ALT: 20 U/L (ref 0–35)
Albumin: 4.2 g/dL (ref 3.5–5.2)
Bilirubin, Direct: 0 mg/dL (ref 0.0–0.3)
Total Protein: 7.2 g/dL (ref 6.0–8.3)

## 2012-05-05 MED ORDER — LISINOPRIL-HYDROCHLOROTHIAZIDE 20-12.5 MG PO TABS: 1.0000 | ORAL_TABLET | Freq: Every day | ORAL | Status: DC

## 2012-05-05 MED ORDER — DILTIAZEM HCL ER COATED BEADS 240 MG PO CP24: 240.0000 mg | ORAL_CAPSULE | Freq: Every day | ORAL | Status: DC

## 2012-05-05 NOTE — Progress Notes (Signed)
  Subjective:     Karina Hawkins is a 46 y.o. female and is here for a comprehensive physical exam. The patient reports no problems.  History   Social History  . Marital Status: Married    Spouse Name: Lewis    Number of Children: 2  . Years of Education: N/A   Occupational History  . levelor    Social History Main Topics  . Smoking status: Never Smoker   . Smokeless tobacco: Never Used  . Alcohol Use: 0.0 oz/week     socially 1x per month  . Drug Use: No  . Sexually Active: Yes -- Female partner(s)   Other Topics Concern  . Not on file   Social History Narrative   Exercise--no   Health Maintenance  Topic Date Due  . Influenza Vaccine  07/04/2012  . Pap Smear  07/12/2014  . Tetanus/tdap  07/24/2019    The following portions of the patient's history were reviewed and updated as appropriate: allergies, current medications, past family history, past medical history, past social history, past surgical history and problem list.  Review of Systems Review of Systems  Constitutional: Negative for activity change, appetite change and fatigue.  HENT: Negative for hearing loss, congestion, tinnitus and ear discharge.  dentist q44m Eyes: Negative for visual disturbance (see optho q1y -- vision corrected to 20/20 with glasses).  Respiratory: Negative for cough, chest tightness and shortness of breath.   Cardiovascular: Negative for chest pain, palpitations and leg swelling.  Gastrointestinal: Negative for abdominal pain, diarrhea, constipation and abdominal distention.  Genitourinary: Negative for urgency, frequency, decreased urine volume and difficulty urinating.  Musculoskeletal: Negative for back pain, arthralgias and gait problem.  Skin: Negative for color change, pallor and rash.  Neurological: Negative for dizziness, light-headedness, numbness and headaches.  Hematological: Negative for adenopathy. Does not bruise/bleed easily.  Psychiatric/Behavioral: Negative for suicidal  ideas, confusion, sleep disturbance, self-injury, dysphoric mood, decreased concentration and agitation.       Objective:    BP 122/70  Pulse 72  Temp 97 F (36.1 C) (Oral)  Ht 5' 4.25" (1.632 m)  Wt 185 lb 3.2 oz (84.006 kg)  BMI 31.54 kg/m2  SpO2 97%  LMP 04/08/2012 General appearance: alert, cooperative, appears stated age and no distress Head: Normocephalic, without obvious abnormality, atraumatic Eyes: conjunctivae/corneas clear. PERRL, EOM's intact. Fundi benign. Ears: normal TM's and external ear canals both ears Nose: Nares normal. Septum midline. Mucosa normal. No drainage or sinus tenderness. Throat: lips, mucosa, and tongue normal; teeth and gums normal Neck: no adenopathy, no carotid bruit, no JVD, supple, symmetrical, trachea midline and thyroid not enlarged, symmetric, no tenderness/mass/nodules Back: symmetric, no curvature. ROM normal. No CVA tenderness. Lungs: clear to auscultation bilaterally Breasts: gyn Heart: regular rate and rhythm, S1, S2 normal, no murmur, click, rub or gallop Abdomen: soft, non-tender; bowel sounds normal; no masses,  no organomegaly Pelvic: deferred--per gyn Extremities: extremities normal, atraumatic, no cyanosis or edema Pulses: 2+ and symmetric Skin: Skin color, texture, turgor normal. No rashes or lesions Lymph nodes: Cervical, supraclavicular, and axillary nodes normal. Neurologic: Alert and oriented X 3, normal strength and tone. Normal symmetric reflexes. Normal coordination and gait psych-- no depression or anxiety    Assessment:    Healthy female exam.      Plan:  Check fasting labs  ghm utd See After Visit Summary for Counseling Recommendations

## 2012-05-05 NOTE — Patient Instructions (Signed)
Preventive Care for Adults, Female A healthy lifestyle and preventive care can promote health and wellness. Preventive health guidelines for women include the following key practices.  A routine yearly physical is a good way to check with your caregiver about your health and preventive screening. It is a chance to share any concerns and updates on your health, and to receive a thorough exam.   Visit your dentist for a routine exam and preventive care every 6 months. Brush your teeth twice a day and floss once a day. Good oral hygiene prevents tooth decay and gum disease.   The frequency of eye exams is based on your age, health, family medical history, use of contact lenses, and other factors. Follow your caregiver's recommendations for frequency of eye exams.   Eat a healthy diet. Foods like vegetables, fruits, whole grains, low-fat dairy products, and lean protein foods contain the nutrients you need without too many calories. Decrease your intake of foods high in solid fats, added sugars, and salt. Eat the right amount of calories for you.Get information about a proper diet from your caregiver, if necessary.   Regular physical exercise is one of the most important things you can do for your health. Most adults should get at least 150 minutes of moderate-intensity exercise (any activity that increases your heart rate and causes you to sweat) each week. In addition, most adults need muscle-strengthening exercises on 2 or more days a week.   Maintain a healthy weight. The body mass index (BMI) is a screening tool to identify possible weight problems. It provides an estimate of body fat based on height and weight. Your caregiver can help determine your BMI, and can help you achieve or maintain a healthy weight.For adults 20 years and older:   A BMI below 18.5 is considered underweight.   A BMI of 18.5 to 24.9 is normal.   A BMI of 25 to 29.9 is considered overweight.   A BMI of 30 and above is  considered obese.   Maintain normal blood lipids and cholesterol levels by exercising and minimizing your intake of saturated fat. Eat a balanced diet with plenty of fruit and vegetables. Blood tests for lipids and cholesterol should begin at age 20 and be repeated every 5 years. If your lipid or cholesterol levels are high, you are over 50, or you are at high risk for heart disease, you may need your cholesterol levels checked more frequently.Ongoing high lipid and cholesterol levels should be treated with medicines if diet and exercise are not effective.   If you smoke, find out from your caregiver how to quit. If you do not use tobacco, do not start.   If you are pregnant, do not drink alcohol. If you are breastfeeding, be very cautious about drinking alcohol. If you are not pregnant and choose to drink alcohol, do not exceed 1 drink per day. One drink is considered to be 12 ounces (355 mL) of beer, 5 ounces (148 mL) of wine, or 1.5 ounces (44 mL) of liquor.   Avoid use of street drugs. Do not share needles with anyone. Ask for help if you need support or instructions about stopping the use of drugs.   High blood pressure causes heart disease and increases the risk of stroke. Your blood pressure should be checked at least every 1 to 2 years. Ongoing high blood pressure should be treated with medicines if weight loss and exercise are not effective.   If you are 55 to 45   years old, ask your caregiver if you should take aspirin to prevent strokes.   Diabetes screening involves taking a blood sample to check your fasting blood sugar level. This should be done once every 3 years, after age 45, if you are within normal weight and without risk factors for diabetes. Testing should be considered at a younger age or be carried out more frequently if you are overweight and have at least 1 risk factor for diabetes.   Breast cancer screening is essential preventive care for women. You should practice "breast  self-awareness." This means understanding the normal appearance and feel of your breasts and may include breast self-examination. Any changes detected, no matter how small, should be reported to a caregiver. Women in their 20s and 30s should have a clinical breast exam (CBE) by a caregiver as part of a regular health exam every 1 to 3 years. After age 40, women should have a CBE every year. Starting at age 40, women should consider having a mammography (breast X-ray test) every year. Women who have a family history of breast cancer should talk to their caregiver about genetic screening. Women at a high risk of breast cancer should talk to their caregivers about having magnetic resonance imaging (MRI) and a mammography every year.   The Pap test is a screening test for cervical cancer. A Pap test can show cell changes on the cervix that might become cervical cancer if left untreated. A Pap test is a procedure in which cells are obtained and examined from the lower end of the uterus (cervix).   Women should have a Pap test starting at age 21.   Between ages 21 and 29, Pap tests should be repeated every 2 years.   Beginning at age 30, you should have a Pap test every 3 years as long as the past 3 Pap tests have been normal.   Some women have medical problems that increase the chance of getting cervical cancer. Talk to your caregiver about these problems. It is especially important to talk to your caregiver if a new problem develops soon after your last Pap test. In these cases, your caregiver may recommend more frequent screening and Pap tests.   The above recommendations are the same for women who have or have not gotten the vaccine for human papillomavirus (HPV).   If you had a hysterectomy for a problem that was not cancer or a condition that could lead to cancer, then you no longer need Pap tests. Even if you no longer need a Pap test, a regular exam is a good idea to make sure no other problems are  starting.   If you are between ages 65 and 70, and you have had normal Pap tests going back 10 years, you no longer need Pap tests. Even if you no longer need a Pap test, a regular exam is a good idea to make sure no other problems are starting.   If you have had past treatment for cervical cancer or a condition that could lead to cancer, you need Pap tests and screening for cancer for at least 20 years after your treatment.   If Pap tests have been discontinued, risk factors (such as a new sexual partner) need to be reassessed to determine if screening should be resumed.   The HPV test is an additional test that may be used for cervical cancer screening. The HPV test looks for the virus that can cause the cell changes on the cervix.   The cells collected during the Pap test can be tested for HPV. The HPV test could be used to screen women aged 30 years and older, and should be used in women of any age who have unclear Pap test results. After the age of 30, women should have HPV testing at the same frequency as a Pap test.   Colorectal cancer can be detected and often prevented. Most routine colorectal cancer screening begins at the age of 50 and continues through age 75. However, your caregiver may recommend screening at an earlier age if you have risk factors for colon cancer. On a yearly basis, your caregiver may provide home test kits to check for hidden blood in the stool. Use of a small camera at the end of a tube, to directly examine the colon (sigmoidoscopy or colonoscopy), can detect the earliest forms of colorectal cancer. Talk to your caregiver about this at age 50, when routine screening begins. Direct examination of the colon should be repeated every 5 to 10 years through age 75, unless early forms of pre-cancerous polyps or small growths are found.   Hepatitis C blood testing is recommended for all people born from 1945 through 1965 and any individual with known risks for hepatitis C.    Practice safe sex. Use condoms and avoid high-risk sexual practices to reduce the spread of sexually transmitted infections (STIs). STIs include gonorrhea, chlamydia, syphilis, trichomonas, herpes, HPV, and human immunodeficiency virus (HIV). Herpes, HIV, and HPV are viral illnesses that have no cure. They can result in disability, cancer, and death. Sexually active women aged 25 and younger should be checked for chlamydia. Older women with new or multiple partners should also be tested for chlamydia. Testing for other STIs is recommended if you are sexually active and at increased risk.   Osteoporosis is a disease in which the bones lose minerals and strength with aging. This can result in serious bone fractures. The risk of osteoporosis can be identified using a bone density scan. Women ages 65 and over and women at risk for fractures or osteoporosis should discuss screening with their caregivers. Ask your caregiver whether you should take a calcium supplement or vitamin D to reduce the rate of osteoporosis.   Menopause can be associated with physical symptoms and risks. Hormone replacement therapy is available to decrease symptoms and risks. You should talk to your caregiver about whether hormone replacement therapy is right for you.   Use sunscreen with sun protection factor (SPF) of 30 or more. Apply sunscreen liberally and repeatedly throughout the day. You should seek shade when your shadow is shorter than you. Protect yourself by wearing long sleeves, pants, a wide-brimmed hat, and sunglasses year round, whenever you are outdoors.   Once a month, do a whole body skin exam, using a mirror to look at the skin on your back. Notify your caregiver of new moles, moles that have irregular borders, moles that are larger than a pencil eraser, or moles that have changed in shape or color.   Stay current with required immunizations.   Influenza. You need a dose every fall (or winter). The composition of  the flu vaccine changes each year, so being vaccinated once is not enough.   Pneumococcal polysaccharide. You need 1 to 2 doses if you smoke cigarettes or if you have certain chronic medical conditions. You need 1 dose at age 65 (or older) if you have never been vaccinated.   Tetanus, diphtheria, pertussis (Tdap, Td). Get 1 dose of   Tdap vaccine if you are younger than age 65, are over 65 and have contact with an infant, are a healthcare worker, are pregnant, or simply want to be protected from whooping cough. After that, you need a Td booster dose every 10 years. Consult your caregiver if you have not had at least 3 tetanus and diphtheria-containing shots sometime in your life or have a deep or dirty wound.   HPV. You need this vaccine if you are a woman age 26 or younger. The vaccine is given in 3 doses over 6 months.   Measles, mumps, rubella (MMR). You need at least 1 dose of MMR if you were born in 1957 or later. You may also need a second dose.   Meningococcal. If you are age 19 to 21 and a first-year college student living in a residence hall, or have one of several medical conditions, you need to get vaccinated against meningococcal disease. You may also need additional booster doses.   Zoster (shingles). If you are age 60 or older, you should get this vaccine.   Varicella (chickenpox). If you have never had chickenpox or you were vaccinated but received only 1 dose, talk to your caregiver to find out if you need this vaccine.   Hepatitis A. You need this vaccine if you have a specific risk factor for hepatitis A virus infection or you simply wish to be protected from this disease. The vaccine is usually given as 2 doses, 6 to 18 months apart.   Hepatitis B. You need this vaccine if you have a specific risk factor for hepatitis B virus infection or you simply wish to be protected from this disease. The vaccine is given in 3 doses, usually over 6 months.  Preventive Services /  Frequency Ages 19 to 39  Blood pressure check.** / Every 1 to 2 years.   Lipid and cholesterol check.** / Every 5 years beginning at age 20.   Clinical breast exam.** / Every 3 years for women in their 20s and 30s.   Pap test.** / Every 2 years from ages 21 through 29. Every 3 years starting at age 30 through age 65 or 70 with a history of 3 consecutive normal Pap tests.   HPV screening.** / Every 3 years from ages 30 through ages 65 to 70 with a history of 3 consecutive normal Pap tests.   Hepatitis C blood test.** / For any individual with known risks for hepatitis C.   Skin self-exam. / Monthly.   Influenza immunization.** / Every year.   Pneumococcal polysaccharide immunization.** / 1 to 2 doses if you smoke cigarettes or if you have certain chronic medical conditions.   Tetanus, diphtheria, pertussis (Tdap, Td) immunization. / A one-time dose of Tdap vaccine. After that, you need a Td booster dose every 10 years.   HPV immunization. / 3 doses over 6 months, if you are 26 and younger.   Measles, mumps, rubella (MMR) immunization. / You need at least 1 dose of MMR if you were born in 1957 or later. You may also need a second dose.   Meningococcal immunization. / 1 dose if you are age 19 to 21 and a first-year college student living in a residence hall, or have one of several medical conditions, you need to get vaccinated against meningococcal disease. You may also need additional booster doses.   Varicella immunization.** / Consult your caregiver.   Hepatitis A immunization.** / Consult your caregiver. 2 doses, 6 to 18 months   apart.   Hepatitis B immunization.** / Consult your caregiver. 3 doses usually over 6 months.  Ages 40 to 64  Blood pressure check.** / Every 1 to 2 years.   Lipid and cholesterol check.** / Every 5 years beginning at age 20.   Clinical breast exam.** / Every year after age 40.   Mammogram.** / Every year beginning at age 40 and continuing for as  long as you are in good health. Consult with your caregiver.   Pap test.** / Every 3 years starting at age 30 through age 65 or 70 with a history of 3 consecutive normal Pap tests.   HPV screening.** / Every 3 years from ages 30 through ages 65 to 70 with a history of 3 consecutive normal Pap tests.   Fecal occult blood test (FOBT) of stool. / Every year beginning at age 50 and continuing until age 75. You may not need to do this test if you get a colonoscopy every 10 years.   Flexible sigmoidoscopy or colonoscopy.** / Every 5 years for a flexible sigmoidoscopy or every 10 years for a colonoscopy beginning at age 50 and continuing until age 75.   Hepatitis C blood test.** / For all people born from 1945 through 1965 and any individual with known risks for hepatitis C.   Skin self-exam. / Monthly.   Influenza immunization.** / Every year.   Pneumococcal polysaccharide immunization.** / 1 to 2 doses if you smoke cigarettes or if you have certain chronic medical conditions.   Tetanus, diphtheria, pertussis (Tdap, Td) immunization.** / A one-time dose of Tdap vaccine. After that, you need a Td booster dose every 10 years.   Measles, mumps, rubella (MMR) immunization. / You need at least 1 dose of MMR if you were born in 1957 or later. You may also need a second dose.   Varicella immunization.** / Consult your caregiver.   Meningococcal immunization.** / Consult your caregiver.   Hepatitis A immunization.** / Consult your caregiver. 2 doses, 6 to 18 months apart.   Hepatitis B immunization.** / Consult your caregiver. 3 doses, usually over 6 months.  Ages 65 and over  Blood pressure check.** / Every 1 to 2 years.   Lipid and cholesterol check.** / Every 5 years beginning at age 20.   Clinical breast exam.** / Every year after age 40.   Mammogram.** / Every year beginning at age 40 and continuing for as long as you are in good health. Consult with your caregiver.   Pap test.** /  Every 3 years starting at age 30 through age 65 or 70 with a 3 consecutive normal Pap tests. Testing can be stopped between 65 and 70 with 3 consecutive normal Pap tests and no abnormal Pap or HPV tests in the past 10 years.   HPV screening.** / Every 3 years from ages 30 through ages 65 or 70 with a history of 3 consecutive normal Pap tests. Testing can be stopped between 65 and 70 with 3 consecutive normal Pap tests and no abnormal Pap or HPV tests in the past 10 years.   Fecal occult blood test (FOBT) of stool. / Every year beginning at age 50 and continuing until age 75. You may not need to do this test if you get a colonoscopy every 10 years.   Flexible sigmoidoscopy or colonoscopy.** / Every 5 years for a flexible sigmoidoscopy or every 10 years for a colonoscopy beginning at age 50 and continuing until age 75.   Hepatitis   C blood test.** / For all people born from 1945 through 1965 and any individual with known risks for hepatitis C.   Osteoporosis screening.** / A one-time screening for women ages 65 and over and women at risk for fractures or osteoporosis.   Skin self-exam. / Monthly.   Influenza immunization.** / Every year.   Pneumococcal polysaccharide immunization.** / 1 dose at age 65 (or older) if you have never been vaccinated.   Tetanus, diphtheria, pertussis (Tdap, Td) immunization. / A one-time dose of Tdap vaccine if you are over 65 and have contact with an infant, are a healthcare worker, or simply want to be protected from whooping cough. After that, you need a Td booster dose every 10 years.   Varicella immunization.** / Consult your caregiver.   Meningococcal immunization.** / Consult your caregiver.   Hepatitis A immunization.** / Consult your caregiver. 2 doses, 6 to 18 months apart.   Hepatitis B immunization.** / Check with your caregiver. 3 doses, usually over 6 months.  ** Family history and personal history of risk and conditions may change your caregiver's  recommendations. Document Released: 11/16/2001 Document Revised: 09/09/2011 Document Reviewed: 02/15/2011 [employer] Patient Information 2012 [employer]. 

## 2012-05-05 NOTE — Assessment & Plan Note (Signed)
Stable con't meds 

## 2012-05-08 LAB — URINE CULTURE: Colony Count: 85000

## 2012-06-12 ENCOUNTER — Encounter (INDEPENDENT_AMBULATORY_CARE_PROVIDER_SITE_OTHER): Payer: BC Managed Care – PPO | Admitting: Surgery

## 2012-07-12 ENCOUNTER — Ambulatory Visit (INDEPENDENT_AMBULATORY_CARE_PROVIDER_SITE_OTHER): Payer: BC Managed Care – PPO | Admitting: Emergency Medicine

## 2012-07-12 VITALS — BP 120/80 | HR 84 | Temp 99.3°F | Resp 16 | Ht 65.0 in | Wt 196.4 lb

## 2012-07-12 DIAGNOSIS — R509 Fever, unspecified: Secondary | ICD-10-CM

## 2012-07-12 DIAGNOSIS — R52 Pain, unspecified: Secondary | ICD-10-CM

## 2012-07-12 DIAGNOSIS — J111 Influenza due to unidentified influenza virus with other respiratory manifestations: Secondary | ICD-10-CM

## 2012-07-12 DIAGNOSIS — R05 Cough: Secondary | ICD-10-CM

## 2012-07-12 LAB — POCT CBC
Hemoglobin: 13.4 g/dL (ref 12.2–16.2)
MPV: 10 fL (ref 0–99.8)
POC Granulocyte: 4.1 (ref 2–6.9)
POC MID %: 6.2 %M (ref 0–12)
RBC: 4.83 M/uL (ref 4.04–5.48)

## 2012-07-12 LAB — POCT INFLUENZA A/B
Influenza A, POC: NEGATIVE
Influenza B, POC: NEGATIVE

## 2012-07-12 LAB — POCT URINALYSIS DIPSTICK
Bilirubin, UA: NEGATIVE
Glucose, UA: NEGATIVE
Leukocytes, UA: NEGATIVE
Nitrite, UA: NEGATIVE
pH, UA: 7

## 2012-07-12 LAB — POCT UA - MICROSCOPIC ONLY
WBC, Ur, HPF, POC: NEGATIVE
Yeast, UA: NEGATIVE

## 2012-07-12 LAB — POCT RAPID STREP A (OFFICE): Rapid Strep A Screen: NEGATIVE

## 2012-07-12 MED ORDER — OSELTAMIVIR PHOSPHATE 75 MG PO CAPS: 75.0000 mg | ORAL_CAPSULE | Freq: Two times a day (BID) | ORAL | Status: DC

## 2012-07-12 NOTE — Progress Notes (Signed)
  Subjective:    Patient ID: Karina Hawkins, female    DOB: 09/03/1966, 46 y.o.   MRN: [id number]  HPI patient complains of body aches, chills, fever and sore throat.  Fever last night of 101.  Traveled to [place] one week ago.  Also has a dry cough and fatigue.  Was sent home from work due to illness today.    Review of Systems patient is otherwise in good health she has no ongoing medical problem     Objective:   Physical Exam HEENT exam reveals mild redness of the posterior pharynx. The neck is supple. Chest is clear to auscultation and percussion. Cardiac exam is a regular rate and rhythm. Abdomen soft and nontender.        Assessment & Plan:  Patient symptoms; however. She has had a sore throat, myalgias, fever, and chills.

## 2012-07-12 NOTE — Patient Instructions (Signed)
Influenza Facts  Flu (influenza) is a contagious respiratory illness caused by the influenza viruses. It can cause mild to severe illness. While most healthy people recover from the flu without specific treatment and without complications, older people, young children, and people with certain health conditions are at higher risk for serious complications from the flu, including death.  CAUSES    The flu virus is spread from person to person by respiratory droplets from coughing and sneezing.   A person can also become infected by touching an object or surface with a virus on it and then touching their mouth, eye or nose.   Adults may be able to infect others from 1 day before symptoms occur and up to 7 days after getting sick. So it is possible to give someone the flu even before you know you are sick and continue to infect others while you are sick.  SYMPTOMS    Fever (usually high).   Headache.   Tiredness (can be extreme).   Cough.   Sore throat.   Runny or stuffy nose.   Body aches.   Diarrhea and vomiting may also occur, particularly in children.   These symptoms are referred to as "flu-like symptoms". A lot of different illnesses, including the common cold, can have similar symptoms.  DIAGNOSIS    There are tests that can determine if you have the flu as long you are tested within the first 2 or 3 days of illness.   A doctor's exam and additional tests may be needed to identify if you have a disease that is a complicating the flu.  RISKS AND COMPLICATIONS   Some of the complications caused by the flu include:   Bacterial pneumonia or progressive pneumonia caused by the flu virus.   Loss of body fluids (dehydration).   Worsening of chronic medical conditions, such as heart failure, asthma, or diabetes.   Sinus problems and ear infections.  HOME CARE INSTRUCTIONS    Seek medical care early on.   If you are at high risk from complications of the flu, consult your health-care provider as soon  as you develop flu-like symptoms. Those at high risk for complications include:   People 65 years or older.   People with chronic medical conditions, including diabetes.   Pregnant women.   Young children.   Your caregiver may recommend use of an antiviral medication to help treat the flu.   If you get the flu, get plenty of rest, drink a lot of liquids, and avoid using alcohol and tobacco.   You can take over-the-counter medications to relieve the symptoms of the flu if your caregiver approves. (Never give aspirin to children or teenagers who have flu-like symptoms, particularly fever).  PREVENTION   The single best way to prevent the flu is to get a flu vaccine each fall. Other measures that can help protect against the flu are:   Antiviral Medications   A number of antiviral drugs are approved for use in preventing the flu. These are prescription medications, and a doctor should be consulted before they are used.   Habits for Good Health   Cover your nose and mouth with a tissue when you cough or sneeze, throw the tissue away after you use it.   Wash your hands often with soap and water, especially after you cough or sneeze. If you are not near water, use an alcohol-based hand cleaner.   Avoid people who are sick.   If you get the   flu, stay home from work or school. Avoid contact with other people so that you do not make them sick, too.   Try not to touch your eyes, nose, or mouth as germs ore often spread this way.  IN CHILDREN, EMERGENCY WARNING SIGNS THAT NEED URGENT MEDICAL ATTENTION:   Fast breathing or trouble breathing.   Bluish skin color.   Not drinking enough fluids.   Not waking up or not interacting.   Being so irritable that the child does not want to be held.   Flu-like symptoms improve but then return with fever and worse cough.   Fever with a rash.  IN ADULTS, EMERGENCY WARNING SIGNS THAT NEED URGENT MEDICAL ATTENTION:   Difficulty breathing or shortness of breath.   Pain  or pressure in the chest or abdomen.   Sudden dizziness.   Confusion.   Severe or persistent vomiting.  SEEK IMMEDIATE MEDICAL CARE IF:   You or someone you know is experiencing any of the symptoms above. When you arrive at the emergency center,report that you think you have the flu. You may be asked to wear a mask and/or sit in a secluded area to protect others from getting sick.  MAKE SURE YOU:    Understand these instructions.   Monitor your condition.   Seek medical care if you are getting worse, or not improving.  Document Released: 09/23/2003 Document Revised: 12/13/2011 Document Reviewed: 06/19/2009  [employer] Patient Information 2013 [employer].

## 2012-11-07 ENCOUNTER — Encounter: Payer: Self-pay | Admitting: Family Medicine

## 2012-11-07 ENCOUNTER — Ambulatory Visit (INDEPENDENT_AMBULATORY_CARE_PROVIDER_SITE_OTHER): Payer: BC Managed Care – PPO | Admitting: Family Medicine

## 2012-11-07 VITALS — BP 124/76 | HR 87 | Temp 98.3°F | Wt 172.8 lb

## 2012-11-07 DIAGNOSIS — I10 Essential (primary) hypertension: Secondary | ICD-10-CM

## 2012-11-07 DIAGNOSIS — E663 Overweight: Secondary | ICD-10-CM

## 2012-11-07 MED ORDER — DIETHYLPROPION HCL 25 MG PO TABS: ORAL_TABLET | ORAL | Status: DC

## 2012-11-07 NOTE — Progress Notes (Signed)
  Subjective:    Patient here for follow-up of elevated blood pressure.  She is exercising and is adherent to a low-salt diet.  Blood pressure is well controlled at home. Cardiac symptoms: none. Patient denies: chest pain, chest pressure/discomfort, claudication, dyspnea, exertional chest pressure/discomfort, fatigue, irregular heart beat, lower extremity edema, near-syncope, orthopnea, palpitations, paroxysmal nocturnal dyspnea, syncope and tachypnea. Cardiovascular risk factors: hypertension and obesity but is actively losing weight with low carb diet.. Use of agents associated with hypertension: amphetamines. History of target organ damage: none.  The following portions of the patient's history were reviewed and updated as appropriate: allergies, current medications, past family history, past medical history, past social history, past surgical history and problem list.  Review of Systems Pertinent items are noted in HPI.     Objective:    BP 124/76  Pulse 87  Temp 98.3 F (36.8 C) (Oral)  Wt 172 lb 12.8 oz (78.382 kg)  SpO2 97% General appearance: alert, cooperative, appears stated age and no distress Lungs: clear to auscultation bilaterally Heart: S1, S2 normal Extremities: extremities normal, atraumatic, no cyanosis or edema    Assessment:    Hypertension, normal blood pressure . Evidence of target organ damage: none.    Plan:    Medication: no change. Dietary sodium restriction. Regular aerobic exercise. Follow up: 6 months and as needed.

## 2012-11-07 NOTE — Patient Instructions (Signed)

## 2013-06-12 ENCOUNTER — Other Ambulatory Visit: Payer: Self-pay | Admitting: *Deleted

## 2013-06-12 DIAGNOSIS — I10 Essential (primary) hypertension: Secondary | ICD-10-CM

## 2013-06-12 MED ORDER — LISINOPRIL-HYDROCHLOROTHIAZIDE 20-12.5 MG PO TABS: 1.0000 | ORAL_TABLET | Freq: Every day | ORAL | Status: DC

## 2013-06-12 NOTE — Telephone Encounter (Signed)
Rx filled and sent to [facility]. SW, CMA 

## 2013-06-13 ENCOUNTER — Telehealth: Payer: Self-pay | Admitting: *Deleted

## 2013-06-13 DIAGNOSIS — I10 Essential (primary) hypertension: Secondary | ICD-10-CM

## 2013-06-13 MED ORDER — DILTIAZEM HCL ER COATED BEADS 240 MG PO CP24: ORAL_CAPSULE | ORAL | Status: DC

## 2013-06-13 NOTE — Telephone Encounter (Signed)
Rx refill request for diltiazem 24 Last ov 11/07/12 Last date filled 05/05/12 #90 R 3 No UDS  No Contract   Please Advise  Ag cma

## 2013-06-13 NOTE — Telephone Encounter (Signed)
Ok to refill x1-- pt due ov

## 2013-08-11 ENCOUNTER — Other Ambulatory Visit: Payer: Self-pay | Admitting: Family Medicine

## 2013-08-13 ENCOUNTER — Other Ambulatory Visit: Payer: Self-pay | Admitting: Obstetrics & Gynecology

## 2013-08-23 ENCOUNTER — Telehealth: Payer: Self-pay

## 2013-08-23 NOTE — Telephone Encounter (Addendum)
Left message for call back identifiable Medication and allergies: reviewed and updated  90 day supply/mail order: na Local pharmacy: [facility] [address]   Immunizations due:  UTD  A/P:   No changes to FH or PSH Health Maintenance  Topic Date Due  . Influenza Vaccine  05/04/2014  . Mammogram  08/13/2014  . Pap Smear  08/13/2016  . Tetanus/tdap  07/24/2019   To Discuss with Provider: Look at spots to see if she needs to go to derm

## 2013-08-24 ENCOUNTER — Encounter: Payer: Self-pay | Admitting: Family Medicine

## 2013-08-24 ENCOUNTER — Ambulatory Visit (INDEPENDENT_AMBULATORY_CARE_PROVIDER_SITE_OTHER): Payer: BC Managed Care – PPO | Admitting: Family Medicine

## 2013-08-24 VITALS — BP 122/76 | HR 79 | Resp 16 | Wt 162.6 lb

## 2013-08-24 DIAGNOSIS — Z0000 Encounter for general adult medical examination without abnormal findings: Secondary | ICD-10-CM

## 2013-08-24 DIAGNOSIS — I10 Essential (primary) hypertension: Secondary | ICD-10-CM

## 2013-08-24 DIAGNOSIS — F411 Generalized anxiety disorder: Secondary | ICD-10-CM

## 2013-08-24 MED ORDER — LISINOPRIL-HYDROCHLOROTHIAZIDE 20-12.5 MG PO TABS: ORAL_TABLET | ORAL | Status: DC

## 2013-08-24 MED ORDER — ALPRAZOLAM 0.25 MG PO TABS: 0.2500 mg | ORAL_TABLET | Freq: Three times a day (TID) | ORAL | Status: DC | PRN

## 2013-08-24 MED ORDER — DILTIAZEM HCL ER COATED BEADS 240 MG PO CP24: ORAL_CAPSULE | ORAL | Status: DC

## 2013-08-24 NOTE — Patient Instructions (Signed)
Preventive Care for Adults, Female A healthy lifestyle and preventive care can promote health and wellness. Preventive health guidelines for women include the following key practices.  A routine yearly physical is a good way to check with your caregiver about your health and preventive screening. It is a chance to share any concerns and updates on your health, and to receive a thorough exam.  Visit your dentist for a routine exam and preventive care every 6 months. Brush your teeth twice a day and floss once a day. Good oral hygiene prevents tooth decay and gum disease.  The frequency of eye exams is based on your age, health, family medical history, use of contact lenses, and other factors. Follow your caregiver's recommendations for frequency of eye exams.  Eat a healthy diet. Foods like vegetables, fruits, whole grains, low-fat dairy products, and lean protein foods contain the nutrients you need without too many calories. Decrease your intake of foods high in solid fats, added sugars, and salt. Eat the right amount of calories for you.Get information about a proper diet from your caregiver, if necessary.  Regular physical exercise is one of the most important things you can do for your health. Most adults should get at least 150 minutes of moderate-intensity exercise (any activity that increases your heart rate and causes you to sweat) each week. In addition, most adults need muscle-strengthening exercises on 2 or more days a week.  Maintain a healthy weight. The body mass index (BMI) is a screening tool to identify possible weight problems. It provides an estimate of body fat based on height and weight. Your caregiver can help determine your BMI, and can help you achieve or maintain a healthy weight.For adults 20 years and older:  A BMI below 18.5 is considered underweight.  A BMI of 18.5 to 24.9 is normal.  A BMI of 25 to 29.9 is considered overweight.  A BMI of 30 and above is  considered obese.  Maintain normal blood lipids and cholesterol levels by exercising and minimizing your intake of saturated fat. Eat a balanced diet with plenty of fruit and vegetables. Blood tests for lipids and cholesterol should begin at age 20 and be repeated every 5 years. If your lipid or cholesterol levels are high, you are over 50, or you are at high risk for heart disease, you may need your cholesterol levels checked more frequently.Ongoing high lipid and cholesterol levels should be treated with medicines if diet and exercise are not effective.  If you smoke, find out from your caregiver how to quit. If you do not use tobacco, do not start.  Lung cancer screening is recommended for adults aged 55 80 years who are at high risk for developing lung cancer because of a history of smoking. Yearly low-dose computed tomography (CT) is recommended for people who have at least a 30-pack-year history of smoking and are a current smoker or have quit within the past 15 years. A pack year of smoking is smoking an average of 1 pack of cigarettes a day for 1 year (for example: 1 pack a day for 30 years or 2 packs a day for 15 years). Yearly screening should continue until the smoker has stopped smoking for at least 15 years. Yearly screening should also be stopped for people who develop a health problem that would prevent them from having lung cancer treatment.  If you are pregnant, do not drink alcohol. If you are breastfeeding, be very cautious about drinking alcohol. If you are   not pregnant and choose to drink alcohol, do not exceed 1 drink per day. One drink is considered to be 12 ounces (355 mL) of beer, 5 ounces (148 mL) of wine, or 1.5 ounces (44 mL) of liquor.  Avoid use of street drugs. Do not share needles with anyone. Ask for help if you need support or instructions about stopping the use of drugs.  High blood pressure causes heart disease and increases the risk of stroke. Your blood pressure  should be checked at least every 1 to 2 years. Ongoing high blood pressure should be treated with medicines if weight loss and exercise are not effective.  If you are 55 to 46 years old, ask your caregiver if you should take aspirin to prevent strokes.  Diabetes screening involves taking a blood sample to check your fasting blood sugar level. This should be done once every 3 years, after age 45, if you are within normal weight and without risk factors for diabetes. Testing should be considered at a younger age or be carried out more frequently if you are overweight and have at least 1 risk factor for diabetes.  Breast cancer screening is essential preventive care for women. You should practice "breast self-awareness." This means understanding the normal appearance and feel of your breasts and may include breast self-examination. Any changes detected, no matter how small, should be reported to a caregiver. Women in their 20s and 30s should have a clinical breast exam (CBE) by a caregiver as part of a regular health exam every 1 to 3 years. After age 40, women should have a CBE every year. Starting at age 40, women should consider having a mammography (breast X-ray test) every year. Women who have a family history of breast cancer should talk to their caregiver about genetic screening. Women at a high risk of breast cancer should talk to their caregivers about having magnetic resonance imaging (MRI) and a mammography every year.  Breast cancer gene (BRCA)-related cancer risk assessment is recommended for women who have family members with BRCA-related cancers. BRCA-related cancers include breast, ovarian, tubal, and peritoneal cancers. Having family members with these cancers may be associated with an increased risk for harmful changes (mutations) in the breast cancer genes BRCA1 and BRCA2. Results of the assessment will determine the need for genetic counseling and BRCA1 and BRCA2 testing.  The Pap test is  a screening test for cervical cancer. A Pap test can show cell changes on the cervix that might become cervical cancer if left untreated. A Pap test is a procedure in which cells are obtained and examined from the lower end of the uterus (cervix).  Women should have a Pap test starting at age 21.  Between ages 21 and 29, Pap tests should be repeated every 2 years.  Beginning at age 30, you should have a Pap test every 3 years as long as the past 3 Pap tests have been normal.  Some women have medical problems that increase the chance of getting cervical cancer. Talk to your caregiver about these problems. It is especially important to talk to your caregiver if a new problem develops soon after your last Pap test. In these cases, your caregiver may recommend more frequent screening and Pap tests.  The above recommendations are the same for women who have or have not gotten the vaccine for human papillomavirus (HPV).  If you had a hysterectomy for a problem that was not cancer or a condition that could lead to cancer, then   you no longer need Pap tests. Even if you no longer need a Pap test, a regular exam is a good idea to make sure no other problems are starting.  If you are between ages 65 and 70, and you have had normal Pap tests going back 10 years, you no longer need Pap tests. Even if you no longer need a Pap test, a regular exam is a good idea to make sure no other problems are starting.  If you have had past treatment for cervical cancer or a condition that could lead to cancer, you need Pap tests and screening for cancer for at least 20 years after your treatment.  If Pap tests have been discontinued, risk factors (such as a new sexual partner) need to be reassessed to determine if screening should be resumed.  The HPV test is an additional test that may be used for cervical cancer screening. The HPV test looks for the virus that can cause the cell changes on the cervix. The cells collected  during the Pap test can be tested for HPV. The HPV test could be used to screen women aged 30 years and older, and should be used in women of any age who have unclear Pap test results. After the age of 30, women should have HPV testing at the same frequency as a Pap test.  Colorectal cancer can be detected and often prevented. Most routine colorectal cancer screening begins at the age of 50 and continues through age 75. However, your caregiver may recommend screening at an earlier age if you have risk factors for colon cancer. On a yearly basis, your caregiver may provide home test kits to check for hidden blood in the stool. Use of a small camera at the end of a tube, to directly examine the colon (sigmoidoscopy or colonoscopy), can detect the earliest forms of colorectal cancer. Talk to your caregiver about this at age 50, when routine screening begins. Direct examination of the colon should be repeated every 5 to 10 years through age 75, unless early forms of pre-cancerous polyps or small growths are found.  Hepatitis C blood testing is recommended for all people born from 1945 through 1965 and any individual with known risks for hepatitis C.  Practice safe sex. Use condoms and avoid high-risk sexual practices to reduce the spread of sexually transmitted infections (STIs). STIs include gonorrhea, chlamydia, syphilis, trichomonas, herpes, HPV, and human immunodeficiency virus (HIV). Herpes, HIV, and HPV are viral illnesses that have no cure. They can result in disability, cancer, and death. Sexually active women aged 25 and younger should be checked for chlamydia. Older women with new or multiple partners should also be tested for chlamydia. Testing for other STIs is recommended if you are sexually active and at increased risk.  Osteoporosis is a disease in which the bones lose minerals and strength with aging. This can result in serious bone fractures. The risk of osteoporosis can be identified using a  bone density scan. Women ages 65 and over and women at risk for fractures or osteoporosis should discuss screening with their caregivers. Ask your caregiver whether you should take a calcium supplement or vitamin D to reduce the rate of osteoporosis.  Menopause can be associated with physical symptoms and risks. Hormone replacement therapy is available to decrease symptoms and risks. You should talk to your caregiver about whether hormone replacement therapy is right for you.  Use sunscreen. Apply sunscreen liberally and repeatedly throughout the day. You should seek shade   when your shadow is shorter than you. Protect yourself by wearing long sleeves, pants, a wide-brimmed hat, and sunglasses year round, whenever you are outdoors.  Once a month, do a whole body skin exam, using a mirror to look at the skin on your back. Notify your caregiver of new moles, moles that have irregular borders, moles that are larger than a pencil eraser, or moles that have changed in shape or color.  Stay current with required immunizations.  Influenza vaccine. All adults should be immunized every year.  Tetanus, diphtheria, and acellular pertussis (Td, Tdap) vaccine. Pregnant women should receive 1 dose of Tdap vaccine during each pregnancy. The dose should be obtained regardless of the length of time since the last dose. Immunization is preferred during the 27th to 36th week of gestation. An adult who has not previously received Tdap or who does not know her vaccine status should receive 1 dose of Tdap. This initial dose should be followed by tetanus and diphtheria toxoids (Td) booster doses every 10 years. Adults with an unknown or incomplete history of completing a 3-dose immunization series with Td-containing vaccines should begin or complete a primary immunization series including a Tdap dose. Adults should receive a Td booster every 10 years.  Varicella vaccine. An adult without evidence of immunity to varicella  should receive 2 doses or a second dose if she has previously received 1 dose. Pregnant females who do not have evidence of immunity should receive the first dose after pregnancy. This first dose should be obtained before leaving the health care facility. The second dose should be obtained 4 8 weeks after the first dose.  Human papillomavirus (HPV) vaccine. Females aged 13 26 years who have not received the vaccine previously should obtain the 3-dose series. The vaccine is not recommended for use in pregnant females. However, pregnancy testing is not needed before receiving a dose. If a female is found to be pregnant after receiving a dose, no treatment is needed. In that case, the remaining doses should be delayed until after the pregnancy. Immunization is recommended for any person with an immunocompromised condition through the age of 26 years if she did not get any or all doses earlier. During the 3-dose series, the second dose should be obtained 4 8 weeks after the first dose. The third dose should be obtained 24 weeks after the first dose and 16 weeks after the second dose.  Zoster vaccine. One dose is recommended for adults aged 60 years or older unless certain conditions are present.  Measles, mumps, and rubella (MMR) vaccine. Adults born before 1957 generally are considered immune to measles and mumps. Adults born in 1957 or later should have 1 or more doses of MMR vaccine unless there is a contraindication to the vaccine or there is laboratory evidence of immunity to each of the three diseases. A routine second dose of MMR vaccine should be obtained at least 28 days after the first dose for students attending postsecondary schools, health care workers, or international travelers. People who received inactivated measles vaccine or an unknown type of measles vaccine during 1963 1967 should receive 2 doses of MMR vaccine. People who received inactivated mumps vaccine or an unknown type of mumps vaccine  before 1979 and are at high risk for mumps infection should consider immunization with 2 doses of MMR vaccine. For females of childbearing age, rubella immunity should be determined. If there is no evidence of immunity, females who are not pregnant should be vaccinated. If there   is no evidence of immunity, females who are pregnant should delay immunization until after pregnancy. Unvaccinated health care workers born before 1957 who lack laboratory evidence of measles, mumps, or rubella immunity or laboratory confirmation of disease should consider measles and mumps immunization with 2 doses of MMR vaccine or rubella immunization with 1 dose of MMR vaccine.  Pneumococcal 13-valent conjugate (PCV13) vaccine. When indicated, a person who is uncertain of her immunization history and has no record of immunization should receive the PCV13 vaccine. An adult aged 19 years or older who has certain medical conditions and has not been previously immunized should receive 1 dose of PCV13 vaccine. This PCV13 should be followed with a dose of pneumococcal polysaccharide (PPSV23) vaccine. The PPSV23 vaccine dose should be obtained at least 8 weeks after the dose of PCV13 vaccine. An adult aged 19 years or older who has certain medical conditions and previously received 1 or more doses of PPSV23 vaccine should receive 1 dose of PCV13. The PCV13 vaccine dose should be obtained 1 or more years after the last PPSV23 vaccine dose.  Pneumococcal polysaccharide (PPSV23) vaccine. When PCV13 is also indicated, PCV13 should be obtained first. All adults aged 65 years and older should be immunized. An adult younger than age 65 years who has certain medical conditions should be immunized. Any person who resides in a nursing home or long-term care facility should be immunized. An adult smoker should be immunized. People with an immunocompromised condition and certain other conditions should receive both PCV13 and PPSV23 vaccines. People  with human immunodeficiency virus (HIV) infection should be immunized as soon as possible after diagnosis. Immunization during chemotherapy or radiation therapy should be avoided. Routine use of PPSV23 vaccine is not recommended for American Indians, [place] Natives, or people younger than 65 years unless there are medical conditions that require PPSV23 vaccine. When indicated, people who have unknown immunization and have no record of immunization should receive PPSV23 vaccine. One-time revaccination 5 years after the first dose of PPSV23 is recommended for people aged 19 64 years who have chronic kidney failure, nephrotic syndrome, asplenia, or immunocompromised conditions. People who received 1 2 doses of PPSV23 before age 65 years should receive another dose of PPSV23 vaccine at age 65 years or later if at least 5 years have passed since the previous dose. Doses of PPSV23 are not needed for people immunized with PPSV23 at or after age 65 years.  Meningococcal vaccine. Adults with asplenia or persistent complement component deficiencies should receive 2 doses of quadrivalent meningococcal conjugate (MenACWY-D) vaccine. The doses should be obtained at least 2 months apart. Microbiologists working with certain meningococcal bacteria, military recruits, people at risk during an outbreak, and people who travel to or live in countries with a high rate of meningitis should be immunized. A first-year college student up through age 21 years who is living in a residence hall should receive a dose if she did not receive a dose on or after her 16th birthday. Adults who have certain high-risk conditions should receive one or more doses of vaccine.  Hepatitis A vaccine. Adults who wish to be protected from this disease, have certain high-risk conditions, work with hepatitis A-infected animals, work in hepatitis A research labs, or travel to or work in countries with a high rate of hepatitis A should be immunized. Adults  who were previously unvaccinated and who anticipate close contact with an international adoptee during the first 60 days after arrival in the [place] from a country   with a high rate of hepatitis A should be immunized.  Hepatitis B vaccine. Adults who wish to be protected from this disease, have certain high-risk conditions, may be exposed to blood or other infectious body fluids, are household contacts or sex partners of hepatitis B positive people, are clients or workers in certain care facilities, or travel to or work in countries with a high rate of hepatitis B should be immunized.  Haemophilus influenzae type b (Hib) vaccine. A previously unvaccinated person with asplenia or sickle cell disease or having a scheduled splenectomy should receive 1 dose of Hib vaccine. Regardless of previous immunization, a recipient of a hematopoietic stem cell transplant should receive a 3-dose series 6 12 months after her successful transplant. Hib vaccine is not recommended for adults with HIV infection. Preventive Services / Frequency Ages 19 to 39  Blood pressure check.** / Every 1 to 2 years.  Lipid and cholesterol check.** / Every 5 years beginning at age 20.  Clinical breast exam.** / Every 3 years for women in their 20s and 30s.  BRCA-related cancer risk assessment.** / For women who have family members with a BRCA-related cancer (breast, ovarian, tubal, or peritoneal cancers).  Pap test.** / Every 2 years from ages 21 through 29. Every 3 years starting at age 30 through age 65 or 70 with a history of 3 consecutive normal Pap tests.  HPV screening.** / Every 3 years from ages 30 through ages 65 to 70 with a history of 3 consecutive normal Pap tests.  Hepatitis C blood test.** / For any individual with known risks for hepatitis C.  Skin self-exam. / Monthly.  Influenza vaccine. / Every year.  Tetanus, diphtheria, and acellular pertussis (Tdap, Td) vaccine.** / Consult your caregiver. Pregnant  women should receive 1 dose of Tdap vaccine during each pregnancy. 1 dose of Td every 10 years.  Varicella vaccine.** / Consult your caregiver. Pregnant females who do not have evidence of immunity should receive the first dose after pregnancy.  HPV vaccine. / 3 doses over 6 months, if 26 and younger. The vaccine is not recommended for use in pregnant females. However, pregnancy testing is not needed before receiving a dose.  Measles, mumps, rubella (MMR) vaccine.** / You need at least 1 dose of MMR if you were born in 1957 or later. You may also need a 2nd dose. For females of childbearing age, rubella immunity should be determined. If there is no evidence of immunity, females who are not pregnant should be vaccinated. If there is no evidence of immunity, females who are pregnant should delay immunization until after pregnancy.  Pneumococcal 13-valent conjugate (PCV13) vaccine.** / Consult your caregiver.  Pneumococcal polysaccharide (PPSV23) vaccine.** / 1 to 2 doses if you smoke cigarettes or if you have certain conditions.  Meningococcal vaccine.** / 1 dose if you are age 19 to 21 years and a first-year college student living in a residence hall, or have one of several medical conditions, you need to get vaccinated against meningococcal disease. You may also need additional booster doses.  Hepatitis A vaccine.** / Consult your caregiver.  Hepatitis B vaccine.** / Consult your caregiver.  Haemophilus influenzae type b (Hib) vaccine.** / Consult your caregiver. Ages 40 to 64  Blood pressure check.** / Every 1 to 2 years.  Lipid and cholesterol check.** / Every 5 years beginning at age 20.  Lung cancer screening. / Every year if you are aged 55 80 years and have a 30-pack-year history of smoking and   currently smoke or have quit within the past 15 years. Yearly screening is stopped once you have quit smoking for at least 15 years or develop a health problem that would prevent you from having  lung cancer treatment.  Clinical breast exam.** / Every year after age 40.  BRCA-related cancer risk assessment.** / For women who have family members with a BRCA-related cancer (breast, ovarian, tubal, or peritoneal cancers).  Mammogram.** / Every year beginning at age 40 and continuing for as long as you are in good health. Consult with your caregiver.  Pap test.** / Every 3 years starting at age 30 through age 65 or 70 with a history of 3 consecutive normal Pap tests.  HPV screening.** / Every 3 years from ages 30 through ages 65 to 70 with a history of 3 consecutive normal Pap tests.  Fecal occult blood test (FOBT) of stool. / Every year beginning at age 50 and continuing until age 75. You may not need to do this test if you get a colonoscopy every 10 years.  Flexible sigmoidoscopy or colonoscopy.** / Every 5 years for a flexible sigmoidoscopy or every 10 years for a colonoscopy beginning at age 50 and continuing until age 75.  Hepatitis C blood test.** / For all people born from 1945 through 1965 and any individual with known risks for hepatitis C.  Skin self-exam. / Monthly.  Influenza vaccine. / Every year.  Tetanus, diphtheria, and acellular pertussis (Tdap/Td) vaccine.** / Consult your caregiver. Pregnant women should receive 1 dose of Tdap vaccine during each pregnancy. 1 dose of Td every 10 years.  Varicella vaccine.** / Consult your caregiver. Pregnant females who do not have evidence of immunity should receive the first dose after pregnancy.  Zoster vaccine.** / 1 dose for adults aged 60 years or older.  Measles, mumps, rubella (MMR) vaccine.** / You need at least 1 dose of MMR if you were born in 1957 or later. You may also need a 2nd dose. For females of childbearing age, rubella immunity should be determined. If there is no evidence of immunity, females who are not pregnant should be vaccinated. If there is no evidence of immunity, females who are pregnant should delay  immunization until after pregnancy.  Pneumococcal 13-valent conjugate (PCV13) vaccine.** / Consult your caregiver.  Pneumococcal polysaccharide (PPSV23) vaccine.** / 1 to 2 doses if you smoke cigarettes or if you have certain conditions.  Meningococcal vaccine.** / Consult your caregiver.  Hepatitis A vaccine.** / Consult your caregiver.  Hepatitis B vaccine.** / Consult your caregiver.  Haemophilus influenzae type b (Hib) vaccine.** / Consult your caregiver. Ages 65 and over  Blood pressure check.** / Every 1 to 2 years.  Lipid and cholesterol check.** / Every 5 years beginning at age 20.  Lung cancer screening. / Every year if you are aged 55 80 years and have a 30-pack-year history of smoking and currently smoke or have quit within the past 15 years. Yearly screening is stopped once you have quit smoking for at least 15 years or develop a health problem that would prevent you from having lung cancer treatment.  Clinical breast exam.** / Every year after age 40.  BRCA-related cancer risk assessment.** / For women who have family members with a BRCA-related cancer (breast, ovarian, tubal, or peritoneal cancers).  Mammogram.** / Every year beginning at age 40 and continuing for as long as you are in good health. Consult with your caregiver.  Pap test.** / Every 3 years starting at age   30 through age 65 or 70 with a 3 consecutive normal Pap tests. Testing can be stopped between 65 and 70 with 3 consecutive normal Pap tests and no abnormal Pap or HPV tests in the past 10 years.  HPV screening.** / Every 3 years from ages 30 through ages 65 or 70 with a history of 3 consecutive normal Pap tests. Testing can be stopped between 65 and 70 with 3 consecutive normal Pap tests and no abnormal Pap or HPV tests in the past 10 years.  Fecal occult blood test (FOBT) of stool. / Every year beginning at age 50 and continuing until age 75. You may not need to do this test if you get a colonoscopy  every 10 years.  Flexible sigmoidoscopy or colonoscopy.** / Every 5 years for a flexible sigmoidoscopy or every 10 years for a colonoscopy beginning at age 50 and continuing until age 75.  Hepatitis C blood test.** / For all people born from 1945 through 1965 and any individual with known risks for hepatitis C.  Osteoporosis screening.** / A one-time screening for women ages 65 and over and women at risk for fractures or osteoporosis.  Skin self-exam. / Monthly.  Influenza vaccine. / Every year.  Tetanus, diphtheria, and acellular pertussis (Tdap/Td) vaccine.** / 1 dose of Td every 10 years.  Varicella vaccine.** / Consult your caregiver.  Zoster vaccine.** / 1 dose for adults aged 60 years or older.  Pneumococcal 13-valent conjugate (PCV13) vaccine.** / Consult your caregiver.  Pneumococcal polysaccharide (PPSV23) vaccine.** / 1 dose for all adults aged 65 years and older.  Meningococcal vaccine.** / Consult your caregiver.  Hepatitis A vaccine.** / Consult your caregiver.  Hepatitis B vaccine.** / Consult your caregiver.  Haemophilus influenzae type b (Hib) vaccine.** / Consult your caregiver. ** Family history and personal history of risk and conditions may change your caregiver's recommendations. Document Released: 11/16/2001 Document Revised: 01/15/2013 Document Reviewed: 02/15/2011 [employer] Patient Information 2014 [employer].  

## 2013-08-24 NOTE — Progress Notes (Signed)
Subjective:     Karina Hawkins is a 47 y.o. female and is here for a comprehensive physical exam. The patient reports no problems.  History   Social History  . Marital Status: Married    Spouse Name: Lewis    Number of Children: 2  . Years of Education: N/A   Occupational History  . levelor    Social History Main Topics  . Smoking status: Never Smoker   . Smokeless tobacco: Never Used  . Alcohol Use: 0.0 oz/week     Comment: socially 1x per month  . Drug Use: No  . Sexual Activity: Yes    Partners: Male   Other Topics Concern  . Not on file   Social History Narrative   Exercise--no   Health Maintenance  Topic Date Due  . Influenza Vaccine  05/04/2014  . Mammogram  08/13/2014  . Pap Smear  08/13/2016  . Tetanus/tdap  07/24/2019    The following portions of the patient's history were reviewed and updated as appropriate:  She  has a past medical history of Pneumonia; HTN (hypertension); Overweight(278.02); Esophageal reflux; and Anxiety. She  does not have any pertinent problems on file. She  has past surgical history that includes Tonsillectomy (Age 47); Anal fissure repair (1993); Tubal ligation; and Foot surgery (2002). Her family history includes Breast cancer in her maternal grandmother; Diabetes in her mother; Emphysema in her maternal grandfather; Hyperlipidemia in her father and mother; Hypertension in her father, maternal grandfather, maternal grandmother, mother, paternal grandfather, and paternal grandmother; Rheum arthritis in her mother; Stroke in her mother. She  reports that she has never smoked. She has never used smokeless tobacco. She reports that she drinks alcohol. She reports that she does not use illicit drugs. She has a current medication list which includes the following prescription(s): alprazolam, diethylpropion hcl, diltiazem, lisinopril-hydrochlorothiazide, and multivitamin. Current Outpatient Prescriptions on File Prior to Visit  Medication Sig  Dispense Refill  . ALPRAZolam (XANAX) 0.25 MG tablet Take 1 tablet (0.25 mg total) by mouth 3 (three) times daily as needed.  90 tablet  5  . Diethylpropion HCl 25 MG TABS Per gyn---1po qd  30 each  0  . diltiazem (CARDIZEM CD) 240 MG 24 hr capsule 1 cap by mouth daily--office visit due now  90 capsule  0  . lisinopril-hydrochlorothiazide (PRINZIDE,ZESTORETIC) 20-12.5 MG per tablet 1 tab by mouth daily--office visit due now  30 tablet  0  . Multiple Vitamin (MULTIVITAMIN) tablet Take 1 tablet by mouth daily.         No current facility-administered medications on file prior to visit.   She is allergic to penicillins and clarithromycin..  Review of Systems Review of Systems  Constitutional: Negative for activity change, appetite change and fatigue.  HENT: Negative for hearing loss, congestion, tinnitus and ear discharge.  dentist q67m Eyes: Negative for visual disturbance (see optho q2y -- vision corrected to 20/20 with glasses).  Respiratory: Negative for cough, chest tightness and shortness of breath.   Cardiovascular: Negative for chest pain, palpitations and leg swelling.  Gastrointestinal: Negative for abdominal pain, diarrhea, constipation and abdominal distention.  Genitourinary: Negative for urgency, frequency, decreased urine volume and difficulty urinating.  Musculoskeletal: Negative for back pain, arthralgias and gait problem.  Skin: Negative for color change, pallor and rash.  Neurological: Negative for dizziness, light-headedness, numbness and headaches.  Hematological: Negative for adenopathy. Does not bruise/bleed easily.  Psychiatric/Behavioral: Negative for suicidal ideas, confusion, sleep disturbance, self-injury, dysphoric mood, decreased concentration and  agitation.       Objective:    BP 122/76  Pulse 79  Resp 16  Wt 162 lb 9.6 oz (73.755 kg)  SpO2 99% General appearance: alert, cooperative, appears stated age and no distress Head: Normocephalic, without  obvious abnormality, atraumatic Eyes: conjunctivae/corneas clear. PERRL, EOM's intact. Fundi benign. Ears: normal TM's and external ear canals both ears Nose: Nares normal. Septum midline. Mucosa normal. No drainage or sinus tenderness. Throat: lips, mucosa, and tongue normal; teeth and gums normal Neck: no adenopathy, no carotid bruit, no JVD, supple, symmetrical, trachea midline and thyroid not enlarged, symmetric, no tenderness/mass/nodules Back: symmetric, no curvature. ROM normal. No CVA tenderness. Lungs: clear to auscultation bilaterally Breasts: gyn Heart: regular rate and rhythm, S1, S2 normal, no murmur, click, rub or gallop Abdomen: soft, non-tender; bowel sounds normal; no masses,  no organomegaly Pelvic: deferred--gyn Extremities: extremities normal, atraumatic, no cyanosis or edema Pulses: 2+ and symmetric Skin: Skin color, texture, turgor normal. No rashes or lesions Lymph nodes: Cervical, supraclavicular, and axillary nodes normal. Neurologic: Alert and oriented X 3, normal strength and tone. Normal symmetric reflexes. Normal coordination and gait Psych-- no depression, no anxiety      Assessment:    Healthy female exam.      Plan:     ghm utd  Check labs See After Visit Summary for Counseling Recommendations

## 2013-08-24 NOTE — Assessment & Plan Note (Signed)
Refill meds stable 

## 2013-08-24 NOTE — Progress Notes (Signed)
Pre visit review using our clinic review tool, if applicable. No additional management support is needed unless otherwise documented below in the visit note. 

## 2013-08-24 NOTE — Assessment & Plan Note (Signed)
Stable---rare use xanax con't meds

## 2013-08-28 ENCOUNTER — Other Ambulatory Visit: Payer: BC Managed Care – PPO

## 2013-09-25 ENCOUNTER — Encounter (HOSPITAL_BASED_OUTPATIENT_CLINIC_OR_DEPARTMENT_OTHER): Payer: Self-pay | Admitting: Emergency Medicine

## 2013-09-25 ENCOUNTER — Emergency Department (HOSPITAL_BASED_OUTPATIENT_CLINIC_OR_DEPARTMENT_OTHER): Payer: BC Managed Care – PPO

## 2013-09-25 ENCOUNTER — Emergency Department (HOSPITAL_BASED_OUTPATIENT_CLINIC_OR_DEPARTMENT_OTHER)
Admission: EM | Admit: 2013-09-25 | Discharge: 2013-09-25 | Disposition: A | Discharge status: Home / Self Care | Payer: BC Managed Care – PPO | Attending: Emergency Medicine | Admitting: Emergency Medicine

## 2013-09-25 ENCOUNTER — Telehealth: Payer: Self-pay

## 2013-09-25 DIAGNOSIS — Z79899 Other long term (current) drug therapy: Secondary | ICD-10-CM | POA: Insufficient documentation

## 2013-09-25 DIAGNOSIS — R5381 Other malaise: Secondary | ICD-10-CM | POA: Insufficient documentation

## 2013-09-25 DIAGNOSIS — E663 Overweight: Secondary | ICD-10-CM | POA: Insufficient documentation

## 2013-09-25 DIAGNOSIS — F411 Generalized anxiety disorder: Secondary | ICD-10-CM | POA: Insufficient documentation

## 2013-09-25 DIAGNOSIS — Z8701 Personal history of pneumonia (recurrent): Secondary | ICD-10-CM | POA: Insufficient documentation

## 2013-09-25 DIAGNOSIS — K2100 Gastro-esophageal reflux disease with esophagitis, without bleeding: Secondary | ICD-10-CM | POA: Insufficient documentation

## 2013-09-25 DIAGNOSIS — I10 Essential (primary) hypertension: Secondary | ICD-10-CM | POA: Insufficient documentation

## 2013-09-25 DIAGNOSIS — Z880 Allergy status to penicillin: Secondary | ICD-10-CM | POA: Insufficient documentation

## 2013-09-25 LAB — CBC WITH DIFFERENTIAL/PLATELET
Basophils Absolute: 0 10*3/uL (ref 0.0–0.1)
Basophils Relative: 0 % (ref 0–1)
Eosinophils Absolute: 0.1 10*3/uL (ref 0.0–0.7)
Hemoglobin: 14.6 g/dL (ref 12.0–15.0)
MCH: 29.4 pg (ref 26.0–34.0)
MCHC: 33.2 g/dL (ref 30.0–36.0)
Monocytes Absolute: 0.5 10*3/uL (ref 0.1–1.0)
Monocytes Relative: 6 % (ref 3–12)
Neutro Abs: 5.5 10*3/uL (ref 1.7–7.7)
Neutrophils Relative %: 71 % (ref 43–77)
RDW: 13.3 % (ref 11.5–15.5)
WBC: 7.7 10*3/uL (ref 4.0–10.5)

## 2013-09-25 LAB — COMPREHENSIVE METABOLIC PANEL
ALT: 27 U/L (ref 0–35)
Albumin: 4.5 g/dL (ref 3.5–5.2)
BUN: 20 mg/dL (ref 6–23)
Calcium: 9.7 mg/dL (ref 8.4–10.5)
Chloride: 101 mEq/L (ref 96–112)
Creatinine, Ser: 0.6 mg/dL (ref 0.50–1.10)
Potassium: 3.5 mEq/L (ref 3.5–5.1)
Sodium: 142 mEq/L (ref 135–145)
Total Protein: 7.8 g/dL (ref 6.0–8.3)

## 2013-09-25 LAB — TROPONIN I
Troponin I: <0.3 ng/mL (ref <0.30)
Troponin I: <0.3 ng/mL (ref <0.30)

## 2013-09-25 MED ORDER — GI COCKTAIL ~~LOC~~
30.0000 mL | Freq: Once | ORAL | Status: AC
  Administered 2013-09-25: 30 mL via ORAL
  Filled 2013-09-25: qty 30

## 2013-09-25 MED ORDER — PANTOPRAZOLE SODIUM 20 MG PO TBEC: 20.0000 mg | DELAYED_RELEASE_TABLET | Freq: Every day | ORAL | Status: DC

## 2013-09-25 MED ORDER — FAMOTIDINE IN NACL 20-0.9 MG/50ML-% IV SOLN
20.0000 mg | Freq: Once | INTRAVENOUS | Status: AC
  Administered 2013-09-25: 20 mg via INTRAVENOUS
  Filled 2013-09-25: qty 50

## 2013-09-25 NOTE — ED Provider Notes (Signed)
CSN: [id number]     Arrival date & time 09/25/13  1536 History   None    Chief Complaint  Patient presents with  . Chest Pain   (Consider location/radiation/quality/duration/timing/severity/associated sxs/prior Treatment) Patient is a 47 y.o. female presenting with chest pain. The history is provided by the patient. No language interpreter was used.  Chest Pain Pain location:  Substernal area and epigastric Pain radiates to:  L jaw, L arm and epigastrium Pain radiates to the back: yes   Pain severity:  Moderate Duration:  2 days Timing:  Constant Progression:  Worsening Chronicity:  New Relieved by:  Nothing Worsened by:  Nothing tried Ineffective treatments:  None tried Associated symptoms: fatigue and heartburn   Risk factors: hypertension     Past Medical History  Diagnosis Date  . Pneumonia   . HTN (hypertension)   . Overweight(278.02)   . Esophageal reflux   . Anxiety    Past Surgical History  Procedure Laterality Date  . Tonsillectomy  Age 82    w/ Adenoids  . Anal fissure repair  1993    Dr Earlene Plater  . Tubal ligation    . Foot surgery  2002    bunion repair   Family History  Problem Relation Age of Onset  . Diabetes Mother   . Hypertension Mother   . Hyperlipidemia Mother   . Rheum arthritis Mother   . Stroke Mother   . Hypertension Father   . Hyperlipidemia Father   . Breast cancer Maternal Grandmother   . Hypertension Maternal Grandmother   . Emphysema Maternal Grandfather   . Hypertension Maternal Grandfather   . Hypertension Paternal Grandmother   . Hypertension Paternal Grandfather    History  Substance Use Topics  . Smoking status: Never Smoker   . Smokeless tobacco: Never Used  . Alcohol Use: 0.0 oz/week     Comment: socially 1x per month   OB History   Grav Para Term Preterm Abortions TAB SAB Ect Mult Living                 Review of Systems  Constitutional: Positive for fatigue.  Cardiovascular: Positive for chest pain.   Gastrointestinal: Positive for heartburn.  All other systems reviewed and are negative.    Allergies  Penicillins and Clarithromycin  Home Medications   Current Outpatient Rx  Name  Route  Sig  Dispense  Refill  . ALPRAZolam (XANAX) 0.25 MG tablet   Oral   Take 1 tablet (0.25 mg total) by mouth 3 (three) times daily as needed.   90 tablet   0   . Diethylpropion HCl 25 MG TABS      Per gyn---1po qd   30 each   0   . diltiazem (CARDIZEM CD) 240 MG 24 hr capsule      1 cap by mouth daily--   90 capsule   03   . lisinopril-hydrochlorothiazide (PRINZIDE,ZESTORETIC) 20-12.5 MG per tablet      1 tab by mouth daily--   90 tablet   3   . Multiple Vitamin (MULTIVITAMIN) tablet   Oral   Take 1 tablet by mouth daily.            BP 114/74  Pulse 82  Temp(Src) 98.2 F (36.8 C)  Resp 16  Ht 5\' 5"  (1.651 m)  Wt 160 lb (72.576 kg)  BMI 26.63 kg/m2  SpO2 100% Physical Exam  Nursing note and vitals reviewed. Constitutional: She is oriented to person, place, and  time. She appears well-developed and well-nourished.  HENT:  Head: Normocephalic.  Right Ear: External ear normal.  Left Ear: External ear normal.  Nose: Nose normal.  Mouth/Throat: Oropharynx is clear and moist.  Eyes: Conjunctivae and EOM are normal. Pupils are equal, round, and reactive to light.  Neck: Normal range of motion.  Cardiovascular: Normal rate, regular rhythm and normal heart sounds.   Pulmonary/Chest: Effort normal and breath sounds normal.  Abdominal: Soft. She exhibits no distension.  Musculoskeletal: Normal range of motion.  Neurological: She is alert and oriented to person, place, and time. She has normal reflexes.  Skin: Skin is warm.  Psychiatric: She has a normal mood and affect.    ED Course  Procedures (including critical care time) Labs Review Labs Reviewed  COMPREHENSIVE METABOLIC PANEL - Abnormal; Notable for the following:    Alkaline Phosphatase 127 (*)    Total  Bilirubin 0.2 (*)    All other components within normal limits  CBC WITH DIFFERENTIAL  TROPONIN I   Imaging Review Dg Chest 2 View  09/25/2013   CLINICAL DATA:  Chest pain.  EXAM: CHEST  2 VIEW  COMPARISON:  10/22/2009  FINDINGS: The heart size and mediastinal contours are within normal limits. Both lungs are clear. The visualized skeletal structures are unremarkable.  IMPRESSION: No active cardiopulmonary disease.   Electronically Signed   By: Salome Holmes M.D.   On: 09/25/2013 16:42    EKG Interpretation    Date/Time:  Tuesday September 25 2013 15:47:15 EST Ventricular Rate:  96 PR Interval:  146 QRS Duration: 86 QT Interval:  348 QTC Calculation: 439 R Axis:   45 Text Interpretation:  Normal sinus rhythm with sinus arrhythmia Possible Left atrial enlargement No significant change since last tracing Confirmed by HARRISON  MD, FORREST ([id number]) on 09/25/2013 8:39:57 PM            MDM Pt given gi cocktail and pepcid,   Pt reports she recently began taking dietthypropion for weight loss.  Pt reports she is having a lot of belching and a lot of indigestion.  Pt reports significant improvement with pepcid and gi cocktail.   I will start pt on protonix and schedule gallbladder ultrasound   1. Reflux esophagitis        Elson Areas, [place] 09/25/13 2054

## 2013-09-25 NOTE — ED Notes (Signed)
Pt reports has had what she thought to be indigestion.  Has had pain in throat intermittently.  Has had increased burping.  Has taken meds for indigestion, not relieved.  Was shopping and suddenly with epigastric and throat pain and tingling in arm.  Reports hx of anxiety and panic attacks.    Pain radiates to shoulder per report with associated sob.

## 2013-09-25 NOTE — ED Notes (Signed)
Pt reports she took 2 baby aspirin and a lorazepam PTA

## 2013-09-25 NOTE — Telephone Encounter (Signed)
47 year old female of Dr Laury Axon presents to the office with intermittent chest pains x several days. Chest pain does not exacerbate with activity. Has not participated in any out of the normal activities. The pain is located mid sternal is sharp and then relaxes. Radiates to the left arm and tingles. Each episode last approximately 1 minute. Patient c/o a sweaty chill. She does not complain of nausea, SOB or palpitations. When swallowing water feels as if it is getting stuck. Patient has taken Zegrid, blood pressure meds and a multivitamin today with no relief. There is no history of cardiac issues or blood clots in her family. No history of smoking. No complaints of dark or tarry stools, abdominal pain or feelings of heartburn. Dysphagia with liquids but has not eaten today. No ankle swelling, calf pain or waking in the night with SOB. Patient states that she had taken diet pills ( prescribed by gyn--[facility]). Last one taken was weeks ago and it caused her chest pain so didn't take anymore. Patient has not felt "right" since.  Has had related dizziness from the pills but not today.  Attempted to relay information to Dr Drue Novel who advises that she go to the ED or call 911 for her. States that we cannot do an EKG on the patient unless we put her on the schedule to be seen. Consulted with TL Vidal Schwalbe, RN) and Site Manager Janna Arch) who instructs to follow Dr Leta Jungling call. Mother and 2 children are with patient today. Gave her the information and advised that her mother drive her to [facility] ED. She verbalizes understanding that she will go to the ED.

## 2013-09-26 ENCOUNTER — Ambulatory Visit (HOSPITAL_BASED_OUTPATIENT_CLINIC_OR_DEPARTMENT_OTHER)
Admission: RE | Admit: 2013-09-26 | Discharge: 2013-09-26 | Disposition: A | Discharge status: Home / Self Care | Payer: BC Managed Care – PPO | Source: Ambulatory Visit | Attending: Emergency Medicine | Admitting: Emergency Medicine

## 2013-09-26 DIAGNOSIS — R079 Chest pain, unspecified: Secondary | ICD-10-CM | POA: Insufficient documentation

## 2013-09-26 DIAGNOSIS — M549 Dorsalgia, unspecified: Secondary | ICD-10-CM | POA: Insufficient documentation

## 2013-09-26 NOTE — ED Provider Notes (Signed)
Medical screening examination/treatment/procedure(s) were performed by non-physician practitioner and as supervising physician I was immediately available for consultation/collaboration.  EKG Interpretation    Date/Time:  Tuesday September 25 2013 15:47:15 EST Ventricular Rate:  96 PR Interval:  146 QRS Duration: 86 QT Interval:  348 QTC Calculation: 439 R Axis:   45 Text Interpretation:  Normal sinus rhythm with sinus arrhythmia Possible Left atrial enlargement No significant change since last tracing Confirmed by Denzel Etienne  MD, Carri Spillers ([id number]) on 09/25/2013 8:39:57 PM              Randa Spike Mort Sawyers, MD 09/26/13 1132

## 2014-02-05 ENCOUNTER — Other Ambulatory Visit: Payer: Self-pay | Admitting: Obstetrics & Gynecology

## 2014-07-30 ENCOUNTER — Other Ambulatory Visit: Payer: Self-pay | Admitting: Dermatology

## 2014-09-16 ENCOUNTER — Other Ambulatory Visit: Payer: Self-pay | Admitting: Obstetrics & Gynecology

## 2014-09-19 LAB — CYTOLOGY - PAP

## 2014-10-04 ENCOUNTER — Other Ambulatory Visit: Payer: Self-pay | Admitting: Family Medicine

## 2014-10-07 NOTE — Telephone Encounter (Signed)
Patient Rx for Lisinopril-HCTZ sent for 30 day supply with no refills.  Patient due for a physical.  Please schedule.

## 2014-11-11 ENCOUNTER — Ambulatory Visit: Payer: Self-pay | Admitting: Family Medicine

## 2014-11-11 ENCOUNTER — Encounter (HOSPITAL_BASED_OUTPATIENT_CLINIC_OR_DEPARTMENT_OTHER): Payer: Self-pay

## 2014-11-11 ENCOUNTER — Emergency Department (HOSPITAL_BASED_OUTPATIENT_CLINIC_OR_DEPARTMENT_OTHER): Payer: BLUE CROSS/BLUE SHIELD

## 2014-11-11 ENCOUNTER — Emergency Department (HOSPITAL_BASED_OUTPATIENT_CLINIC_OR_DEPARTMENT_OTHER)
Admission: EM | Admit: 2014-11-11 | Discharge: 2014-11-11 | Disposition: A | Discharge status: Home / Self Care | Payer: BLUE CROSS/BLUE SHIELD | Attending: Emergency Medicine | Admitting: Emergency Medicine

## 2014-11-11 DIAGNOSIS — Z8701 Personal history of pneumonia (recurrent): Secondary | ICD-10-CM | POA: Diagnosis not present

## 2014-11-11 DIAGNOSIS — I10 Essential (primary) hypertension: Secondary | ICD-10-CM | POA: Insufficient documentation

## 2014-11-11 DIAGNOSIS — E663 Overweight: Secondary | ICD-10-CM | POA: Insufficient documentation

## 2014-11-11 DIAGNOSIS — Z79899 Other long term (current) drug therapy: Secondary | ICD-10-CM | POA: Diagnosis not present

## 2014-11-11 DIAGNOSIS — K219 Gastro-esophageal reflux disease without esophagitis: Secondary | ICD-10-CM | POA: Insufficient documentation

## 2014-11-11 DIAGNOSIS — F419 Anxiety disorder, unspecified: Secondary | ICD-10-CM | POA: Diagnosis not present

## 2014-11-11 DIAGNOSIS — Z880 Allergy status to penicillin: Secondary | ICD-10-CM | POA: Insufficient documentation

## 2014-11-11 DIAGNOSIS — R079 Chest pain, unspecified: Secondary | ICD-10-CM | POA: Diagnosis present

## 2014-11-11 LAB — BASIC METABOLIC PANEL
Anion gap: 5 (ref 5–15)
BUN: 18 mg/dL (ref 6–23)
CHLORIDE: 102 mmol/L (ref 96–112)
CO2: 28 mmol/L (ref 19–32)
CREATININE: 0.56 mg/dL (ref 0.50–1.10)
Calcium: 9.1 mg/dL (ref 8.4–10.5)
GFR calc non Af Amer: >90 mL/min (ref >90)
GLUCOSE: 109 mg/dL — AB (ref 70–99)
Potassium: 3.4 mmol/L — ABNORMAL LOW (ref 3.5–5.1)
Sodium: 135 mmol/L (ref 135–145)

## 2014-11-11 LAB — CBC
HEMATOCRIT: 42.5 % (ref 36.0–46.0)
HEMOGLOBIN: 14.1 g/dL (ref 12.0–15.0)
MCH: 28.8 pg (ref 26.0–34.0)
MCHC: 33.2 g/dL (ref 30.0–36.0)
MCV: 86.7 fL (ref 78.0–100.0)
Platelets: 170 10*3/uL (ref 150–400)
RBC: 4.9 MIL/uL (ref 3.87–5.11)
RDW: 13.4 % (ref 11.5–15.5)
WBC: 5.1 10*3/uL (ref 4.0–10.5)

## 2014-11-11 LAB — TROPONIN I: Troponin I: <0.03 ng/mL (ref <0.031)

## 2014-11-11 MED ORDER — LISINOPRIL-HYDROCHLOROTHIAZIDE 20-12.5 MG PO TABS: 1.0000 | ORAL_TABLET | Freq: Every day | ORAL | Status: DC

## 2014-11-11 MED ORDER — DILTIAZEM HCL ER COATED BEADS 240 MG PO CP24: ORAL_CAPSULE | ORAL | Status: DC

## 2014-11-11 NOTE — ED Provider Notes (Signed)
CSN: [id number]     Arrival date & time 11/11/14  1144 History   First MD Initiated Contact with Patient 11/11/14 1153     Chief Complaint  Patient presents with  . Chest Pain     (Consider location/radiation/quality/duration/timing/severity/associated sxs/prior Treatment) HPI Comments: The patient is a 49 year old female who has a history of hypertension and anxiety. She presents to the hospital with a complaint of chest discomfort and left arm pain when she went to her family doctor's office and was redirected to the emergency department when they found out she was having this discomfort. She reports going in for a normal blood pressure check this morning. She has been on blood pressure medications, has had normal blood pressure until this afternoon when she noticed that her blood pressure was climbing and peaked at a blood pressure of 145/106. She denies having any headaches, visual changes, swelling of the legs and has no risk factors for pulmonary embolism. She does not have high cholesterol, diabetes, does not smoke and has no family history of heart disease. She states that this heaviness in her chest and her left arm started this morning while she was driving in the car, she took a half of a Xanax tablet in hopes that this was anxiety but felt no significant relief. She does report that she exercises passively everyday walking consistently and never has any discomfort in her chest. She goes to the gym occasionally lifting weights, using the treadmill and the exercise bike and has no exercise-induced discomfort either. The symptoms are mild, gradually improving, not associated with fevers chills coughing shortness of breath abdominal pain or back pain.  Patient is a 49 y.o. female presenting with chest pain. The history is provided by the patient.  Chest Pain   Past Medical History  Diagnosis Date  . Pneumonia   . HTN (hypertension)   . Overweight(278.02)   . Esophageal reflux   . Anxiety     Past Surgical History  Procedure Laterality Date  . Tonsillectomy  Age 74    w/ Adenoids  . Anal fissure repair  1993    Dr Rosana Hoes  . Tubal ligation    . Foot surgery  2002    bunion repair   Family History  Problem Relation Age of Onset  . Diabetes Mother   . Hypertension Mother   . Hyperlipidemia Mother   . Rheum arthritis Mother   . Stroke Mother   . Hypertension Father   . Hyperlipidemia Father   . Breast cancer Maternal Grandmother   . Hypertension Maternal Grandmother   . Emphysema Maternal Grandfather   . Hypertension Maternal Grandfather   . Hypertension Paternal Grandmother   . Hypertension Paternal Grandfather    History  Substance Use Topics  . Smoking status: Never Smoker   . Smokeless tobacco: Never Used  . Alcohol Use: 0.0 oz/week     Comment: socially 1x per month   OB History    No data available     Review of Systems  Cardiovascular: Positive for chest pain.  All other systems reviewed and are negative.     Allergies  Penicillins and Clarithromycin  Home Medications   Prior to Admission medications   Medication Sig Start Date End Date Taking? Authorizing Provider  ALPRAZolam (XANAX) 0.25 MG tablet Take 1 tablet (0.25 mg total) by mouth 3 (three) times daily as needed. 08/24/13   Rosalita Chessman, DO  Diethylpropion HCl 25 MG TABS Per gyn---1po qd 11/07/12  Rosalita Chessman, DO  diltiazem (CARDIZEM CD) 240 MG 24 hr capsule 1 cap by mouth daily-- 11/11/14   Johnna Acosta, MD  lisinopril-hydrochlorothiazide (PRINZIDE,ZESTORETIC) 20-12.5 MG per tablet Take 1 tablet by mouth daily. 11/11/14   Johnna Acosta, MD  Multiple Vitamin (MULTIVITAMIN) tablet Take 1 tablet by mouth daily.      Historical Provider, MD  pantoprazole (PROTONIX) 20 MG tablet Take 1 tablet (20 mg total) by mouth daily. 09/25/13   Hollace Kinnier Sofia, PA-C   BP 130/85 mmHg  Pulse 76  Temp(Src) 98.1 F (36.7 C) (Oral)  Resp 17  Ht 5\' 5"  (1.651 m)  Wt 175 lb (79.379 kg)  BMI 29.12  kg/m2  SpO2 99% Physical Exam  Constitutional: She appears well-developed and well-nourished. No distress.  HENT:  Head: Normocephalic and atraumatic.  Mouth/Throat: Oropharynx is clear and moist. No oropharyngeal exudate.  Eyes: Conjunctivae and EOM are normal. Pupils are equal, round, and reactive to light. Right eye exhibits no discharge. Left eye exhibits no discharge. No scleral icterus.  Neck: Normal range of motion. Neck supple. No JVD present. No thyromegaly present.  Cardiovascular: Normal rate, regular rhythm, normal heart sounds and intact distal pulses.  Exam reveals no gallop and no friction rub.   No murmur heard. Pulmonary/Chest: Effort normal and breath sounds normal. No respiratory distress. She has no wheezes. She has no rales.  Abdominal: Soft. Bowel sounds are normal. She exhibits no distension and no mass. There is no tenderness.  Musculoskeletal: Normal range of motion. She exhibits no edema or tenderness.  Lymphadenopathy:    She has no cervical adenopathy.  Neurological: She is alert. Coordination normal.  Skin: Skin is warm and dry. No rash noted. No erythema.  Psychiatric: She has a normal mood and affect. Her behavior is normal.  Nursing note and vitals reviewed.   ED Course  Procedures (including critical care time) Labs Review Labs Reviewed  BASIC METABOLIC PANEL - Abnormal; Notable for the following:    Potassium 3.4 (*)    Glucose, Bld 109 (*)    All other components within normal limits  TROPONIN I  CBC    Imaging Review Dg Chest 2 View  11/11/2014   CLINICAL DATA:  Chest tightness since yesterday  EXAM: CHEST  2 VIEW  COMPARISON:  11/26/2012  FINDINGS: Normal heart size and mediastinal contours. No acute infiltrate or edema. No effusion or pneumothorax. No acute osseous findings.  IMPRESSION: Negative chest.   Electronically Signed   By: Jorje Guild M.D.   On: 11/11/2014 13:12     EKG Interpretation   Date/Time:  Monday November 11 2014  11:49:15 EST Ventricular Rate:  79 PR Interval:  154 QRS Duration: 86 QT Interval:  378 QTC Calculation: 433 R Axis:   64 Text Interpretation:  Normal sinus rhythm Normal ECG since last tracing no  significant change Confirmed by Aylinn Rydberg  MD, Laquita Harlan ([id number]) on 11/11/2014  12:10:44 PM      MDM   Final diagnoses:  Chest pain     heart score of 2, per negative, labs pending. Pt has improved quickly after finding out that her ECG showed no acute findings to suggest ACS Labs normal - she now has mild intermittent fleeting Chest discomfort She is low risk for ACS, will d/w Dr. Etter Sjogren for close f/u Labs reviewed, CXR reviewed, no acute findings.  Unable to reach pt's PMD - pt instructed to call upon d/c to arrange f/u for further testing. Pt now  states that she had run out of her BP meds and feels as though this had increased her anxiety this AM when she saw her BP up to 140.  Johnna Acosta, MD 11/11/14 1351

## 2014-11-11 NOTE — Discharge Instructions (Signed)

## 2014-11-11 NOTE — ED Notes (Signed)
MD at bedside. 

## 2014-11-11 NOTE — ED Notes (Signed)
Pt reports indigestion x 1 week. Sts since Thursday, she has had intermittent chest pains, tightness in chest and tingling in left arm. Sts she had to take a Xanax this morning. Reports feeling like she just swallowed a tennis ball.  Reports tingling in left arm.

## 2015-01-06 ENCOUNTER — Ambulatory Visit (INDEPENDENT_AMBULATORY_CARE_PROVIDER_SITE_OTHER): Payer: BLUE CROSS/BLUE SHIELD | Admitting: Family Medicine

## 2015-01-06 ENCOUNTER — Encounter: Payer: Self-pay | Admitting: Family Medicine

## 2015-01-06 VITALS — BP 128/84 | HR 86 | Temp 98.5°F | Wt 182.6 lb

## 2015-01-06 DIAGNOSIS — K449 Diaphragmatic hernia without obstruction or gangrene: Secondary | ICD-10-CM | POA: Diagnosis not present

## 2015-01-06 DIAGNOSIS — F411 Generalized anxiety disorder: Secondary | ICD-10-CM | POA: Diagnosis not present

## 2015-01-06 DIAGNOSIS — I10 Essential (primary) hypertension: Secondary | ICD-10-CM

## 2015-01-06 DIAGNOSIS — K219 Gastro-esophageal reflux disease without esophagitis: Secondary | ICD-10-CM

## 2015-01-06 MED ORDER — LISINOPRIL-HYDROCHLOROTHIAZIDE 20-12.5 MG PO TABS: 1.0000 | ORAL_TABLET | Freq: Every day | ORAL | Status: DC

## 2015-01-06 MED ORDER — ALPRAZOLAM 0.25 MG PO TABS: 0.2500 mg | ORAL_TABLET | Freq: Three times a day (TID) | ORAL | Status: DC | PRN

## 2015-01-06 MED ORDER — PANTOPRAZOLE SODIUM 20 MG PO TBEC: 20.0000 mg | DELAYED_RELEASE_TABLET | Freq: Every day | ORAL | Status: DC

## 2015-01-06 MED ORDER — DILTIAZEM HCL ER COATED BEADS 240 MG PO CP24: ORAL_CAPSULE | ORAL | Status: DC

## 2015-01-06 NOTE — Progress Notes (Signed)
Patient ID: Karina Hawkins, female    DOB: December 01, 1965  Age: 49 y.o. MRN: [id number]    Subjective:  Subjective HPI Karina Hawkins presents for f/u bp , anxiety and gerd.  Pt has been to er 2x with cp-- dx with anxiety and she has hx hiatal hernia.  No recent episodes of cp.      Review of Systems  Constitutional: Negative for activity change, appetite change, fatigue and unexpected weight change.  Respiratory: Negative for cough and shortness of breath.   Cardiovascular: Negative for chest pain, palpitations and leg swelling.  Gastrointestinal:       + heartburn frequently--- otc nexium helps somewhat.  protonix worked better  Psychiatric/Behavioral: Negative for behavioral problems and dysphoric mood. The patient is not nervous/anxious.     History Past Medical History  Diagnosis Date  . Pneumonia   . HTN (hypertension)   . Overweight(278.02)   . Esophageal reflux   . Anxiety     She has past surgical history that includes Tonsillectomy (Age 25); Anal fissure repair (1993); Tubal ligation; and Foot surgery (2002).   Her family history includes Breast cancer in her maternal grandmother; Diabetes in her mother; Emphysema in her maternal grandfather; Hyperlipidemia in her father and mother; Hypertension in her father, maternal grandfather, maternal grandmother, mother, paternal grandfather, and paternal grandmother; Rheum arthritis in her mother; Stroke in her mother.She reports that she has never smoked. She has never used smokeless tobacco. She reports that she drinks alcohol. She reports that she does not use illicit drugs.  Current Outpatient Prescriptions on File Prior to Visit  Medication Sig Dispense Refill  . Multiple Vitamin (MULTIVITAMIN) tablet Take 1 tablet by mouth daily.      . Diethylpropion HCl 25 MG TABS Per gyn---1po qd (Patient not taking: Reported on 01/06/2015) 30 each 0   No current facility-administered medications on file prior to visit.     Objective:    Objective Physical Exam  Constitutional: She is oriented to person, place, and time. She appears well-developed and well-nourished. No distress.  HENT:  Right Ear: External ear normal.  Left Ear: External ear normal.  Nose: Nose normal.  Mouth/Throat: Oropharynx is clear and moist.  Eyes: EOM are normal. Pupils are equal, round, and reactive to light.  Neck: Normal range of motion. Neck supple.  Cardiovascular: Normal rate, regular rhythm and normal heart sounds.   No murmur heard. Pulmonary/Chest: Effort normal and breath sounds normal. No respiratory distress. She has no wheezes. She has no rales. She exhibits no tenderness.  Neurological: She is alert and oriented to person, place, and time.  Psychiatric: She has a normal mood and affect. Her behavior is normal. Judgment and thought content normal.   BP 128/84 mmHg  Pulse 86  Temp(Src) 98.5 F (36.9 C) (Oral)  Wt 182 lb 9.6 oz (82.827 kg)  SpO2 98% Wt Readings from Last 3 Encounters:  01/06/15 182 lb 9.6 oz (82.827 kg)  11/11/14 175 lb (79.379 kg)  09/25/13 160 lb (72.576 kg)     Lab Results  Component Value Date   WBC 5.1 11/11/2014   HGB 14.1 11/11/2014   HCT 42.5 11/11/2014   PLT 170 11/11/2014   GLUCOSE 109* 11/11/2014   CHOL 154 05/05/2012   TRIG 62.0 05/05/2012   HDL 53.00 05/05/2012   LDLCALC 89 05/05/2012   ALT 27 09/25/2013   AST 23 09/25/2013   NA 135 11/11/2014   K 3.4* 11/11/2014   CL 102 11/11/2014  CREATININE 0.56 11/11/2014   BUN 18 11/11/2014   CO2 28 11/11/2014   TSH 1.72 05/05/2012   INR 1.08 07/03/2010    Dg Chest 2 View  11/11/2014   CLINICAL DATA:  Chest tightness since yesterday  EXAM: CHEST  2 VIEW  COMPARISON:  11/26/2012  FINDINGS: Normal heart size and mediastinal contours. No acute infiltrate or edema. No effusion or pneumothorax. No acute osseous findings.  IMPRESSION: Negative chest.   Electronically Signed   By: Jorje Guild M.D.   On: 11/11/2014 13:12     Assessment &  Plan:  Plan I have changed Ms. Tennant's diltiazem. I am also having her maintain her multivitamin, Diethylpropion HCl, lisinopril-hydrochlorothiazide, ALPRAZolam, and pantoprazole.  Meds ordered this encounter  Medications  . diltiazem (CARDIZEM CD) 240 MG 24 hr capsule    Sig: 1 cap by mouth daily    Dispense:  90 capsule    Refill:  3  . lisinopril-hydrochlorothiazide (PRINZIDE,ZESTORETIC) 20-12.5 MG per tablet    Sig: Take 1 tablet by mouth daily.    Dispense:  90 tablet    Refill:  3  . ALPRAZolam (XANAX) 0.25 MG tablet    Sig: Take 1 tablet (0.25 mg total) by mouth 3 (three) times daily as needed.    Dispense:  90 tablet    Refill:  0  . DISCONTD: pantoprazole (PROTONIX) 20 MG tablet    Sig: Take 1 tablet (20 mg total) by mouth daily.    Dispense:  90 tablet    Refill:  3  . pantoprazole (PROTONIX) 20 MG tablet    Sig: Take 1 tablet (20 mg total) by mouth daily.    Dispense:  90 tablet    Refill:  3    Problem List Items Addressed This Visit    Anxiety state    She only needs xanax occasionally Refill  F/u if symptoms persist      Relevant Medications   ALPRAZolam  (XANAX) tablet   Essential hypertension    Stable con't meds rto 6 months or sooner prn      Relevant Medications   diltiazem (CARDIZEM CD) 24 hr capsule   lisinopril-hydrochlorothiazide (PRINZIDE,ZESTORETIC) 20-12.5 MG per tablet   Hiatal hernia with gastroesophageal reflux - Primary    protonix  h/o diet for gerd      Relevant Medications   pantoprazole (PROTONIX) EC tablet    Other Visit Diagnoses    Generalized anxiety disorder        Relevant Medications    ALPRAZolam  (XANAX) tablet       Follow-up: Return in about 6 months (around 07/08/2015), or if symptoms worsen or fail to improve, for f/u .  Garnet Koyanagi, DO

## 2015-01-06 NOTE — Patient Instructions (Signed)

## 2015-01-06 NOTE — Progress Notes (Signed)
Pre visit review using our clinic review tool, if applicable. No additional management support is needed unless otherwise documented below in the visit note. 

## 2015-01-06 NOTE — Assessment & Plan Note (Signed)
She only needs xanax occasionally Refill  F/u if symptoms persist

## 2015-01-06 NOTE — Assessment & Plan Note (Signed)
Stable con't meds rto 6 months or sooner prn 

## 2015-01-06 NOTE — Assessment & Plan Note (Signed)
protonix  h/o diet for gerd

## 2015-07-08 ENCOUNTER — Ambulatory Visit: Payer: BLUE CROSS/BLUE SHIELD | Admitting: Family Medicine

## 2015-08-26 ENCOUNTER — Ambulatory Visit (INDEPENDENT_AMBULATORY_CARE_PROVIDER_SITE_OTHER): Payer: BLUE CROSS/BLUE SHIELD | Admitting: Family Medicine

## 2015-08-26 ENCOUNTER — Encounter (INDEPENDENT_AMBULATORY_CARE_PROVIDER_SITE_OTHER): Payer: Self-pay

## 2015-08-26 ENCOUNTER — Encounter: Payer: Self-pay | Admitting: Family Medicine

## 2015-08-26 VITALS — BP 134/87 | HR 71 | Ht 65.0 in | Wt 185.0 lb

## 2015-08-26 DIAGNOSIS — M722 Plantar fascial fibromatosis: Secondary | ICD-10-CM | POA: Diagnosis not present

## 2015-08-26 NOTE — Patient Instructions (Signed)
You have plantar fasciitis Take tylenol or aleve as needed for pain  Plantar fascia stretch for 20-30 seconds (do 3 of these) in morning Lowering/raise on a step exercises 3 x 10 once or twice a day - this is very important for long term recovery. Can add heel walks, toe walks forward and backward as well Ice heel for 15 minutes as needed. Avoid flat shoes/barefoot walking as much as possible. Arch straps have been shown to help with pain (wear when up and walking around). Orthotics may be helpful - typically try dr. Zoe Lan active series or our green insoles. We can make you custom ones too if you would like a more permanent insert. Steroid injection is a consideration for short term pain relief if you are struggling. Night splints, physical therapy, active release/massage are also options if not improving. Follow up with me in 6 weeks.

## 2015-09-02 DIAGNOSIS — M722 Plantar fascial fibromatosis: Secondary | ICD-10-CM | POA: Insufficient documentation

## 2015-09-02 NOTE — Assessment & Plan Note (Signed)
shown home exercises to do daily.  Arch binders, arch support.  Icing, tylenol/nsaids if needed.  Consider injection, night splints, PT if not improving.  F/u in 6 weeks.

## 2015-09-02 NOTE — Progress Notes (Signed)
PCP: Garnet Koyanagi, DO  Subjective:   HPI: Patient is a 49 y.o. female here for right heel pain.  Patient reports she's had 5 months of plantar right heel pain. No known injury or trauma. Pain worse in the morning and after a lot of walking. Pain 0/10 at rest, up to 9/10 at worst. Tried gel inserts, heel gel cups. Pain can be sharp. No skin changes, fever, other complaints.  Past Medical History  Diagnosis Date  . Pneumonia   . HTN (hypertension)   . Overweight(278.02)   . Esophageal reflux   . Anxiety     Current Outpatient Prescriptions on File Prior to Visit  Medication Sig Dispense Refill  . ALPRAZolam (XANAX) 0.25 MG tablet Take 1 tablet (0.25 mg total) by mouth 3 (three) times daily as needed. 90 tablet 0  . Diethylpropion HCl 25 MG TABS Per gyn---1po qd (Patient not taking: Reported on 01/06/2015) 30 each 0  . diltiazem (CARDIZEM CD) 240 MG 24 hr capsule 1 cap by mouth daily 90 capsule 3  . lisinopril-hydrochlorothiazide (PRINZIDE,ZESTORETIC) 20-12.5 MG per tablet Take 1 tablet by mouth daily. 90 tablet 3  . Multiple Vitamin (MULTIVITAMIN) tablet Take 1 tablet by mouth daily.      . pantoprazole (PROTONIX) 20 MG tablet Take 1 tablet (20 mg total) by mouth daily. 90 tablet 3   No current facility-administered medications on file prior to visit.    Past Surgical History  Procedure Laterality Date  . Tonsillectomy  Age 33    w/ Adenoids  . Anal fissure repair  1993    Dr Rosana Hoes  . Tubal ligation    . Foot surgery  2002    bunion repair    Allergies  Allergen Reactions  . Penicillins     Throat closes up  . Clarithromycin     Lots of nausea    Social History   Social History  . Marital Status: Married    Spouse Name: Lewis  . Number of Children: 2  . Years of Education: N/A   Occupational History  . levelor    Social History Main Topics  . Smoking status: Never Smoker   . Smokeless tobacco: Never Used  . Alcohol Use: 0.0 oz/week    0 Standard drinks  or equivalent per week     Comment: socially 1x per month  . Drug Use: No  . Sexual Activity:    Partners: Male   Other Topics Concern  . Not on file   Social History Narrative   Exercise--no    Family History  Problem Relation Age of Onset  . Diabetes Mother   . Hypertension Mother   . Hyperlipidemia Mother   . Rheum arthritis Mother   . Stroke Mother   . Hypertension Father   . Hyperlipidemia Father   . Breast cancer Maternal Grandmother   . Hypertension Maternal Grandmother   . Emphysema Maternal Grandfather   . Hypertension Maternal Grandfather   . Hypertension Paternal Grandmother   . Hypertension Paternal Grandfather     BP 134/87 mmHg  Pulse 71  Ht 5\' 5"  (1.651 m)  Wt 185 lb (83.915 kg)  BMI 30.79 kg/m2  Review of Systems: See HPI above.    Objective:  Physical Exam:  Gen: NAD  Right foot/ankle: Ganglion cyst anterior ankle/dorsal foot.  No other deformity, bruising.  FROM TTP plantar heel at calcaneal insertion of plantar fascia. Negative ant drawer and talar tilt.   Negative syndesmotic compression. Thompsons test negative.  NV intact distally.  Left foot/ankle: FROM without pain.    Assessment & Plan:  1. Right plantar fasciitis - shown home exercises to do daily.  Arch binders, arch support.  Icing, tylenol/nsaids if needed.  Consider injection, night splints, PT if not improving.  F/u in 6 weeks.

## 2015-09-23 ENCOUNTER — Other Ambulatory Visit: Payer: Self-pay | Admitting: Obstetrics & Gynecology

## 2015-09-25 LAB — CYTOLOGY - PAP

## 2015-10-03 ENCOUNTER — Ambulatory Visit: Payer: BLUE CROSS/BLUE SHIELD | Admitting: Family Medicine

## 2015-10-14 ENCOUNTER — Ambulatory Visit (INDEPENDENT_AMBULATORY_CARE_PROVIDER_SITE_OTHER): Payer: BLUE CROSS/BLUE SHIELD | Admitting: Family Medicine

## 2015-10-14 ENCOUNTER — Encounter: Payer: Self-pay | Admitting: Family Medicine

## 2015-10-14 VITALS — BP 118/82 | HR 74 | Temp 98.0°F | Resp 16 | Ht 64.25 in | Wt 188.8 lb

## 2015-10-14 DIAGNOSIS — F411 Generalized anxiety disorder: Secondary | ICD-10-CM

## 2015-10-14 DIAGNOSIS — Z0000 Encounter for general adult medical examination without abnormal findings: Secondary | ICD-10-CM | POA: Diagnosis not present

## 2015-10-14 DIAGNOSIS — I10 Essential (primary) hypertension: Secondary | ICD-10-CM

## 2015-10-14 LAB — POCT URINALYSIS DIPSTICK
BILIRUBIN UA: NEGATIVE
Blood, UA: NEGATIVE
Glucose, UA: NEGATIVE
LEUKOCYTES UA: NEGATIVE
Nitrite, UA: NEGATIVE
PH UA: 6
Protein, UA: NEGATIVE
Spec Grav, UA: 1.015
Urobilinogen, UA: 0.2

## 2015-10-14 MED ORDER — DILTIAZEM HCL ER COATED BEADS 240 MG PO CP24: ORAL_CAPSULE | ORAL | Status: DC

## 2015-10-14 MED ORDER — ALPRAZOLAM 0.25 MG PO TABS: 0.2500 mg | ORAL_TABLET | Freq: Three times a day (TID) | ORAL | Status: DC | PRN

## 2015-10-14 MED ORDER — LISINOPRIL-HYDROCHLOROTHIAZIDE 20-12.5 MG PO TABS: 1.0000 | ORAL_TABLET | Freq: Every day | ORAL | Status: DC

## 2015-10-14 NOTE — Progress Notes (Signed)
Subjective:     Karina Hawkins is a 50 y.o. female and is here for a comprehensive physical exam. The patient reports no problems.  Social History   Social History  . Marital Status: Married    Spouse Name: Lewis  . Number of Children: 2  . Years of Education: N/A   Occupational History  . levelor    Social History Main Topics  . Smoking status: Never Smoker   . Smokeless tobacco: Never Used  . Alcohol Use: 0.0 oz/week    0 Standard drinks or equivalent per week     Comment: socially 1x per month  . Drug Use: No  . Sexual Activity:    Partners: Male   Other Topics Concern  . Not on file   Social History Narrative   Exercise--no   Health Maintenance  Topic Date Due  . HIV Screening  01/06/2016 (Originally 02/28/1982)  . INFLUENZA VACCINE  05/04/2016  . MAMMOGRAM  09/22/2016  . PAP SMEAR  09/22/2018  . TETANUS/TDAP  07/24/2019    The following portions of the patient's history were reviewed and updated as appropriate:  She  has a past medical history of Pneumonia; HTN (hypertension); Overweight(278.02); Esophageal reflux; and Anxiety. She  does not have any pertinent problems on file. She  has past surgical history that includes Tonsillectomy (Age 58); Anal fissure repair (1993); Tubal ligation; and Foot surgery (2002). Her family history includes Breast cancer in her maternal grandmother; Diabetes in her mother; Emphysema in her maternal grandfather; Hyperlipidemia in her father and mother; Hypertension in her father, maternal grandfather, maternal grandmother, mother, paternal grandfather, and paternal grandmother; Rheum arthritis in her mother; Stroke in her mother. She  reports that she has never smoked. She has never used smokeless tobacco. She reports that she drinks alcohol. She reports that she does not use illicit drugs. She has a current medication list which includes the following prescription(s): alprazolam, diltiazem, lisinopril-hydrochlorothiazide,  multivitamin, and pantoprazole. Current Outpatient Prescriptions on File Prior to Visit  Medication Sig Dispense Refill  . Multiple Vitamin (MULTIVITAMIN) tablet Take 1 tablet by mouth daily.      . pantoprazole (PROTONIX) 20 MG tablet Take 1 tablet (20 mg total) by mouth daily. (Patient taking differently: Take 20 mg by mouth as needed. ) 90 tablet 3   No current facility-administered medications on file prior to visit.   She is allergic to penicillins and clarithromycin..  Review of Systems Review of Systems  Constitutional: Negative for activity change, appetite change and fatigue.  HENT: Negative for hearing loss, congestion, tinnitus and ear discharge.  dentist q34mEyes: Negative for visual disturbance (see optho q1y -- vision corrected to 20/20 with glasses).  Respiratory: Negative for cough, chest tightness and shortness of breath.   Cardiovascular: Negative for chest pain, palpitations and leg swelling.  Gastrointestinal: Negative for abdominal pain, diarrhea, constipation and abdominal distention.  Genitourinary: Negative for urgency, frequency, decreased urine volume and difficulty urinating.  Musculoskeletal: Negative for back pain, arthralgias and gait problem.  Skin: Negative for color change, pallor and rash.  Neurological: Negative for dizziness, light-headedness, numbness and headaches.  Hematological: Negative for adenopathy. Does not bruise/bleed easily.  Psychiatric/Behavioral: Negative for suicidal ideas, confusion, sleep disturbance, self-injury, dysphoric mood, decreased concentration and agitation.       Objective:    BP 118/82 mmHg  Pulse 74  Temp(Src) 98 F (36.7 C) (Oral)  Resp 16  Ht 5' 4.25" (1.632 m)  Wt 188 lb 12.8 oz (85.639 kg)  BMI 32.15 kg/m2  SpO2 98%  LMP 09/23/2015 General appearance: alert, cooperative, appears stated age and no distress Head: Normocephalic, without obvious abnormality, atraumatic Eyes: conjunctivae/corneas clear.  PERRL, EOM's intact. Fundi benign. Ears: normal TM's and external ear canals both ears Nose: Nares normal. Septum midline. Mucosa normal. No drainage or sinus tenderness. Throat: lips, mucosa, and tongue normal; teeth and gums normal Neck: no adenopathy, no carotid bruit, no JVD, supple, symmetrical, trachea midline and thyroid not enlarged, symmetric, no tenderness/mass/nodules Back: symmetric, no curvature. ROM normal. No CVA tenderness. Lungs: clear to auscultation bilaterally Breasts: gyn Heart: S1, S2 normal Abdomen: soft, non-tender; bowel sounds normal; no masses,  no organomegaly Pelvic: deferred--gyn Extremities: extremities normal, atraumatic, no cyanosis or edema Pulses: 2+ and symmetric Skin: Skin color, texture, turgor normal. No rashes or lesions Lymph nodes: Cervical, supraclavicular, and axillary nodes normal. Neurologic: Alert and oriented X 3, normal strength and tone. Normal symmetric reflexes. Normal coordination and gait Psych-- no depression, no anxiety      Assessment:    Healthy female exam.     Plan:  ghm utd  Check labs See AVS   See After Visit Summary for Counseling Recommendations   1. Essential hypertension stable - diltiazem (CARDIZEM CD) 240 MG 24 hr capsule; 1 cap by mouth daily  Dispense: 90 capsule; Refill: 3 - lisinopril-hydrochlorothiazide (PRINZIDE,ZESTORETIC) 20-12.5 MG tablet; Take 1 tablet by mouth daily.  Dispense: 90 tablet; Refill: 3  2. Generalized anxiety disorder Stable  - ALPRAZolam (XANAX) 0.25 MG tablet; Take 1 tablet (0.25 mg total) by mouth 3 (three) times daily as needed.  Dispense: 90 tablet; Refill: 0  3. Preventative health care See avs ghm utd - Comp Met (CMET) - CBC with Differential/Platelet - Lipid panel - POCT urinalysis dipstick - TSH

## 2015-10-14 NOTE — Patient Instructions (Signed)
Preventive Care for Adults, Female A healthy lifestyle and preventive care can promote health and wellness. Preventive health guidelines for women include the following key practices.  A routine yearly physical is a good way to check with your health care provider about your health and preventive screening. It is a chance to share any concerns and updates on your health and to receive a thorough exam.  Visit your dentist for a routine exam and preventive care every 6 months. Brush your teeth twice a day and floss once a day. Good oral hygiene prevents tooth decay and gum disease.  The frequency of eye exams is based on your age, health, family medical history, use of contact lenses, and other factors. Follow your health care provider's recommendations for frequency of eye exams.  Eat a healthy diet. Foods like vegetables, fruits, whole grains, low-fat dairy products, and lean protein foods contain the nutrients you need without too many calories. Decrease your intake of foods high in solid fats, added sugars, and salt. Eat the right amount of calories for you.Get information about a proper diet from your health care provider, if necessary.  Regular physical exercise is one of the most important things you can do for your health. Most adults should get at least 150 minutes of moderate-intensity exercise (any activity that increases your heart rate and causes you to sweat) each week. In addition, most adults need muscle-strengthening exercises on 2 or more days a week.  Maintain a healthy weight. The body mass index (BMI) is a screening tool to identify possible weight problems. It provides an estimate of body fat based on height and weight. Your health care provider can find your BMI and can help you achieve or maintain a healthy weight.For adults 20 years and older:  A BMI below 18.5 is considered underweight.  A BMI of 18.5 to 24.9 is normal.  A BMI of 25 to 29.9 is considered overweight.  A  BMI of 30 and above is considered obese.  Maintain normal blood lipids and cholesterol levels by exercising and minimizing your intake of saturated fat. Eat a balanced diet with plenty of fruit and vegetables. Blood tests for lipids and cholesterol should begin at age 45 and be repeated every 5 years. If your lipid or cholesterol levels are high, you are over 50, or you are at high risk for heart disease, you may need your cholesterol levels checked more frequently.Ongoing high lipid and cholesterol levels should be treated with medicines if diet and exercise are not working.  If you smoke, find out from your health care provider how to quit. If you do not use tobacco, do not start.  Lung cancer screening is recommended for adults aged 45-80 years who are at high risk for developing lung cancer because of a history of smoking. A yearly low-dose CT scan of the lungs is recommended for people who have at least a 30-pack-year history of smoking and are a current smoker or have quit within the past 15 years. A pack year of smoking is smoking an average of 1 pack of cigarettes a day for 1 year (for example: 1 pack a day for 30 years or 2 packs a day for 15 years). Yearly screening should continue until the smoker has stopped smoking for at least 15 years. Yearly screening should be stopped for people who develop a health problem that would prevent them from having lung cancer treatment.  If you are pregnant, do not drink alcohol. If you are  breastfeeding, be very cautious about drinking alcohol. If you are not pregnant and choose to drink alcohol, do not have more than 1 drink per day. One drink is considered to be 12 ounces (355 mL) of beer, 5 ounces (148 mL) of wine, or 1.5 ounces (44 mL) of liquor.  Avoid use of street drugs. Do not share needles with anyone. Ask for help if you need support or instructions about stopping the use of drugs.  High blood pressure causes heart disease and increases the risk  of stroke. Your blood pressure should be checked at least every 1 to 2 years. Ongoing high blood pressure should be treated with medicines if weight loss and exercise do not work.  If you are 55-79 years old, ask your health care provider if you should take aspirin to prevent strokes.  Diabetes screening is done by taking a blood sample to check your blood glucose level after you have not eaten for a certain period of time (fasting). If you are not overweight and you do not have risk factors for diabetes, you should be screened once every 3 years starting at age 45. If you are overweight or obese and you are 40-70 years of age, you should be screened for diabetes every year as part of your cardiovascular risk assessment.  Breast cancer screening is essential preventive care for women. You should practice "breast self-awareness." This means understanding the normal appearance and feel of your breasts and may include breast self-examination. Any changes detected, no matter how small, should be reported to a health care provider. Women in their 20s and 30s should have a clinical breast exam (CBE) by a health care provider as part of a regular health exam every 1 to 3 years. After age 40, women should have a CBE every year. Starting at age 40, women should consider having a mammogram (breast X-ray test) every year. Women who have a family history of breast cancer should talk to their health care provider about genetic screening. Women at a high risk of breast cancer should talk to their health care providers about having an MRI and a mammogram every year.  Breast cancer gene (BRCA)-related cancer risk assessment is recommended for women who have family members with BRCA-related cancers. BRCA-related cancers include breast, ovarian, tubal, and peritoneal cancers. Having family members with these cancers may be associated with an increased risk for harmful changes (mutations) in the breast cancer genes BRCA1 and  BRCA2. Results of the assessment will determine the need for genetic counseling and BRCA1 and BRCA2 testing.  Your health care provider may recommend that you be screened regularly for cancer of the pelvic organs (ovaries, uterus, and vagina). This screening involves a pelvic examination, including checking for microscopic changes to the surface of your cervix (Pap test). You may be encouraged to have this screening done every 3 years, beginning at age 21.  For women ages 30-65, health care providers may recommend pelvic exams and Pap testing every 3 years, or they may recommend the Pap and pelvic exam, combined with testing for human papilloma virus (HPV), every 5 years. Some types of HPV increase your risk of cervical cancer. Testing for HPV may also be done on women of any age with unclear Pap test results.  Other health care providers may not recommend any screening for nonpregnant women who are considered low risk for pelvic cancer and who do not have symptoms. Ask your health care provider if a screening pelvic exam is right for   you.  If you have had past treatment for cervical cancer or a condition that could lead to cancer, you need Pap tests and screening for cancer for at least 20 years after your treatment. If Pap tests have been discontinued, your risk factors (such as having a new sexual partner) need to be reassessed to determine if screening should resume. Some women have medical problems that increase the chance of getting cervical cancer. In these cases, your health care provider may recommend more frequent screening and Pap tests.  Colorectal cancer can be detected and often prevented. Most routine colorectal cancer screening begins at the age of 50 years and continues through age 75 years. However, your health care provider may recommend screening at an earlier age if you have risk factors for colon cancer. On a yearly basis, your health care provider may provide home test kits to check  for hidden blood in the stool. Use of a small camera at the end of a tube, to directly examine the colon (sigmoidoscopy or colonoscopy), can detect the earliest forms of colorectal cancer. Talk to your health care provider about this at age 50, when routine screening begins. Direct exam of the colon should be repeated every 5-10 years through age 75 years, unless early forms of precancerous polyps or small growths are found.  People who are at an increased risk for hepatitis B should be screened for this virus. You are considered at high risk for hepatitis B if:  You were born in a country where hepatitis B occurs often. Talk with your health care provider about which countries are considered high risk.  Your parents were born in a high-risk country and you have not received a shot to protect against hepatitis B (hepatitis B vaccine).  You have HIV or AIDS.  You use needles to inject street drugs.  You live with, or have sex with, someone who has hepatitis B.  You get hemodialysis treatment.  You take certain medicines for conditions like cancer, organ transplantation, and autoimmune conditions.  Hepatitis C blood testing is recommended for all people born from 1945 through 1965 and any individual with known risks for hepatitis C.  Practice safe sex. Use condoms and avoid high-risk sexual practices to reduce the spread of sexually transmitted infections (STIs). STIs include gonorrhea, chlamydia, syphilis, trichomonas, herpes, HPV, and human immunodeficiency virus (HIV). Herpes, HIV, and HPV are viral illnesses that have no cure. They can result in disability, cancer, and death.  You should be screened for sexually transmitted illnesses (STIs) including gonorrhea and chlamydia if:  You are sexually active and are younger than 24 years.  You are older than 24 years and your health care provider tells you that you are at risk for this type of infection.  Your sexual activity has changed  since you were last screened and you are at an increased risk for chlamydia or gonorrhea. Ask your health care provider if you are at risk.  If you are at risk of being infected with HIV, it is recommended that you take a prescription medicine daily to prevent HIV infection. This is called preexposure prophylaxis (PrEP). You are considered at risk if:  You are sexually active and do not regularly use condoms or know the HIV status of your partner(s).  You take drugs by injection.  You are sexually active with a partner who has HIV.  Talk with your health care provider about whether you are at high risk of being infected with HIV. If   you choose to begin PrEP, you should first be tested for HIV. You should then be tested every 3 months for as long as you are taking PrEP.  Osteoporosis is a disease in which the bones lose minerals and strength with aging. This can result in serious bone fractures or breaks. The risk of osteoporosis can be identified using a bone density scan. Women ages 67 years and over and women at risk for fractures or osteoporosis should discuss screening with their health care providers. Ask your health care provider whether you should take a calcium supplement or vitamin D to reduce the rate of osteoporosis.  Menopause can be associated with physical symptoms and risks. Hormone replacement therapy is available to decrease symptoms and risks. You should talk to your health care provider about whether hormone replacement therapy is right for you.  Use sunscreen. Apply sunscreen liberally and repeatedly throughout the day. You should seek shade when your shadow is shorter than you. Protect yourself by wearing long sleeves, pants, a wide-brimmed hat, and sunglasses year round, whenever you are outdoors.  Once a month, do a whole body skin exam, using a mirror to look at the skin on your back. Tell your health care provider of new moles, moles that have irregular borders, moles that  are larger than a pencil eraser, or moles that have changed in shape or color.  Stay current with required vaccines (immunizations).  Influenza vaccine. All adults should be immunized every year.  Tetanus, diphtheria, and acellular pertussis (Td, Tdap) vaccine. Pregnant women should receive 1 dose of Tdap vaccine during each pregnancy. The dose should be obtained regardless of the length of time since the last dose. Immunization is preferred during the 27th-36th week of gestation. An adult who has not previously received Tdap or who does not know her vaccine status should receive 1 dose of Tdap. This initial dose should be followed by tetanus and diphtheria toxoids (Td) booster doses every 10 years. Adults with an unknown or incomplete history of completing a 3-dose immunization series with Td-containing vaccines should begin or complete a primary immunization series including a Tdap dose. Adults should receive a Td booster every 10 years.  Varicella vaccine. An adult without evidence of immunity to varicella should receive 2 doses or a second dose if she has previously received 1 dose. Pregnant females who do not have evidence of immunity should receive the first dose after pregnancy. This first dose should be obtained before leaving the health care facility. The second dose should be obtained 4-8 weeks after the first dose.  Human papillomavirus (HPV) vaccine. Females aged 13-26 years who have not received the vaccine previously should obtain the 3-dose series. The vaccine is not recommended for use in pregnant females. However, pregnancy testing is not needed before receiving a dose. If a female is found to be pregnant after receiving a dose, no treatment is needed. In that case, the remaining doses should be delayed until after the pregnancy. Immunization is recommended for any person with an immunocompromised condition through the age of 61 years if she did not get any or all doses earlier. During the  3-dose series, the second dose should be obtained 4-8 weeks after the first dose. The third dose should be obtained 24 weeks after the first dose and 16 weeks after the second dose.  Zoster vaccine. One dose is recommended for adults aged 30 years or older unless certain conditions are present.  Measles, mumps, and rubella (MMR) vaccine. Adults born  before 1957 generally are considered immune to measles and mumps. Adults born in 1957 or later should have 1 or more doses of MMR vaccine unless there is a contraindication to the vaccine or there is laboratory evidence of immunity to each of the three diseases. A routine second dose of MMR vaccine should be obtained at least 28 days after the first dose for students attending postsecondary schools, health care workers, or international travelers. People who received inactivated measles vaccine or an unknown type of measles vaccine during 1963-1967 should receive 2 doses of MMR vaccine. People who received inactivated mumps vaccine or an unknown type of mumps vaccine before 1979 and are at high risk for mumps infection should consider immunization with 2 doses of MMR vaccine. For females of childbearing age, rubella immunity should be determined. If there is no evidence of immunity, females who are not pregnant should be vaccinated. If there is no evidence of immunity, females who are pregnant should delay immunization until after pregnancy. Unvaccinated health care workers born before 1957 who lack laboratory evidence of measles, mumps, or rubella immunity or laboratory confirmation of disease should consider measles and mumps immunization with 2 doses of MMR vaccine or rubella immunization with 1 dose of MMR vaccine.  Pneumococcal 13-valent conjugate (PCV13) vaccine. When indicated, a person who is uncertain of his immunization history and has no record of immunization should receive the PCV13 vaccine. All adults 65 years of age and older should receive this  vaccine. An adult aged 19 years or older who has certain medical conditions and has not been previously immunized should receive 1 dose of PCV13 vaccine. This PCV13 should be followed with a dose of pneumococcal polysaccharide (PPSV23) vaccine. Adults who are at high risk for pneumococcal disease should obtain the PPSV23 vaccine at least 8 weeks after the dose of PCV13 vaccine. Adults older than 49 years of age who have normal immune system function should obtain the PPSV23 vaccine dose at least 1 year after the dose of PCV13 vaccine.  Pneumococcal polysaccharide (PPSV23) vaccine. When PCV13 is also indicated, PCV13 should be obtained first. All adults aged 65 years and older should be immunized. An adult younger than age 65 years who has certain medical conditions should be immunized. Any person who resides in a nursing home or long-term care facility should be immunized. An adult smoker should be immunized. People with an immunocompromised condition and certain other conditions should receive both PCV13 and PPSV23 vaccines. People with human immunodeficiency virus (HIV) infection should be immunized as soon as possible after diagnosis. Immunization during chemotherapy or radiation therapy should be avoided. Routine use of PPSV23 vaccine is not recommended for American Indians, [place] Natives, or people younger than 65 years unless there are medical conditions that require PPSV23 vaccine. When indicated, people who have unknown immunization and have no record of immunization should receive PPSV23 vaccine. One-time revaccination 5 years after the first dose of PPSV23 is recommended for people aged 19-64 years who have chronic kidney failure, nephrotic syndrome, asplenia, or immunocompromised conditions. People who received 1-2 doses of PPSV23 before age 65 years should receive another dose of PPSV23 vaccine at age 65 years or later if at least 5 years have passed since the previous dose. Doses of PPSV23 are not  needed for people immunized with PPSV23 at or after age 65 years.  Meningococcal vaccine. Adults with asplenia or persistent complement component deficiencies should receive 2 doses of quadrivalent meningococcal conjugate (MenACWY-D) vaccine. The doses should be obtained   at least 2 months apart. Microbiologists working with certain meningococcal bacteria, [place] recruits, people at risk during an outbreak, and people who travel to or live in countries with a high rate of meningitis should be immunized. A first-year college student up through age 34 years who is living in a residence hall should receive a dose if she did not receive a dose on or after her 16th birthday. Adults who have certain high-risk conditions should receive one or more doses of vaccine.  Hepatitis A vaccine. Adults who wish to be protected from this disease, have certain high-risk conditions, work with hepatitis A-infected animals, work in hepatitis A research labs, or travel to or work in countries with a high rate of hepatitis A should be immunized. Adults who were previously unvaccinated and who anticipate close contact with an international adoptee during the first 60 days after arrival in the [place] from a country with a high rate of hepatitis A should be immunized.  Hepatitis B vaccine. Adults who wish to be protected from this disease, have certain high-risk conditions, may be exposed to blood or other infectious body fluids, are household contacts or sex partners of hepatitis B positive people, are clients or workers in certain care facilities, or travel to or work in countries with a high rate of hepatitis B should be immunized.  Haemophilus influenzae type b (Hib) vaccine. A previously unvaccinated person with asplenia or sickle cell disease or having a scheduled splenectomy should receive 1 dose of Hib vaccine. Regardless of previous immunization, a recipient of a hematopoietic stem cell transplant should receive a  3-dose series 6-12 months after her successful transplant. Hib vaccine is not recommended for adults with HIV infection. Preventive Services / Frequency Ages 35 to 4 years  Blood pressure check.** / Every 3-5 years.  Lipid and cholesterol check.** / Every 5 years beginning at age 60.  Clinical breast exam.** / Every 3 years for women in their 71s and 10s.  BRCA-related cancer risk assessment.** / For women who have family members with a BRCA-related cancer (breast, ovarian, tubal, or peritoneal cancers).  Pap test.** / Every 2 years from ages 76 through 26. Every 3 years starting at age 61 through age 76 or 93 with a history of 3 consecutive normal Pap tests.  HPV screening.** / Every 3 years from ages 37 through ages 60 to 51 with a history of 3 consecutive normal Pap tests.  Hepatitis C blood test.** / For any individual with known risks for hepatitis C.  Skin self-exam. / Monthly.  Influenza vaccine. / Every year.  Tetanus, diphtheria, and acellular pertussis (Tdap, Td) vaccine.** / Consult your health care provider. Pregnant women should receive 1 dose of Tdap vaccine during each pregnancy. 1 dose of Td every 10 years.  Varicella vaccine.** / Consult your health care provider. Pregnant females who do not have evidence of immunity should receive the first dose after pregnancy.  HPV vaccine. / 3 doses over 6 months, if 93 and younger. The vaccine is not recommended for use in pregnant females. However, pregnancy testing is not needed before receiving a dose.  Measles, mumps, rubella (MMR) vaccine.** / You need at least 1 dose of MMR if you were born in 1957 or later. You may also need a 2nd dose. For females of childbearing age, rubella immunity should be determined. If there is no evidence of immunity, females who are not pregnant should be vaccinated. If there is no evidence of immunity, females who are  pregnant should delay immunization until after pregnancy.  Pneumococcal  13-valent conjugate (PCV13) vaccine.** / Consult your health care provider.  Pneumococcal polysaccharide (PPSV23) vaccine.** / 1 to 2 doses if you smoke cigarettes or if you have certain conditions.  Meningococcal vaccine.** / 1 dose if you are age 68 to 8 years and a Market researcher living in a residence hall, or have one of several medical conditions, you need to get vaccinated against meningococcal disease. You may also need additional booster doses.  Hepatitis A vaccine.** / Consult your health care provider.  Hepatitis B vaccine.** / Consult your health care provider.  Haemophilus influenzae type b (Hib) vaccine.** / Consult your health care provider. Ages 7 to 53 years  Blood pressure check.** / Every year.  Lipid and cholesterol check.** / Every 5 years beginning at age 25 years.  Lung cancer screening. / Every year if you are aged 11-80 years and have a 30-pack-year history of smoking and currently smoke or have quit within the past 15 years. Yearly screening is stopped once you have quit smoking for at least 15 years or develop a health problem that would prevent you from having lung cancer treatment.  Clinical breast exam.** / Every year after age 48 years.  BRCA-related cancer risk assessment.** / For women who have family members with a BRCA-related cancer (breast, ovarian, tubal, or peritoneal cancers).  Mammogram.** / Every year beginning at age 41 years and continuing for as long as you are in good health. Consult with your health care provider.  Pap test.** / Every 3 years starting at age 65 years through age 37 or 70 years with a history of 3 consecutive normal Pap tests.  HPV screening.** / Every 3 years from ages 72 years through ages 60 to 40 years with a history of 3 consecutive normal Pap tests.  Fecal occult blood test (FOBT) of stool. / Every year beginning at age 21 years and continuing until age 5 years. You may not need to do this test if you get  a colonoscopy every 10 years.  Flexible sigmoidoscopy or colonoscopy.** / Every 5 years for a flexible sigmoidoscopy or every 10 years for a colonoscopy beginning at age 35 years and continuing until age 48 years.  Hepatitis C blood test.** / For all people born from [birth year] through 1965 and any individual with known risks for hepatitis C.  Skin self-exam. / Monthly.  Influenza vaccine. / Every year.  Tetanus, diphtheria, and acellular pertussis (Tdap/Td) vaccine.** / Consult your health care provider. Pregnant women should receive 1 dose of Tdap vaccine during each pregnancy. 1 dose of Td every 10 years.  Varicella vaccine.** / Consult your health care provider. Pregnant females who do not have evidence of immunity should receive the first dose after pregnancy.  Zoster vaccine.** / 1 dose for adults aged 30 years or older.  Measles, mumps, rubella (MMR) vaccine.** / You need at least 1 dose of MMR if you were born in 1957 or later. You may also need a second dose. For females of childbearing age, rubella immunity should be determined. If there is no evidence of immunity, females who are not pregnant should be vaccinated. If there is no evidence of immunity, females who are pregnant should delay immunization until after pregnancy.  Pneumococcal 13-valent conjugate (PCV13) vaccine.** / Consult your health care provider.  Pneumococcal polysaccharide (PPSV23) vaccine.** / 1 to 2 doses if you smoke cigarettes or if you have certain conditions.  Meningococcal vaccine.** /  Consult your health care provider.  Hepatitis A vaccine.** / Consult your health care provider.  Hepatitis B vaccine.** / Consult your health care provider.  Haemophilus influenzae type b (Hib) vaccine.** / Consult your health care provider. Ages 64 years and over  Blood pressure check.** / Every year.  Lipid and cholesterol check.** / Every 5 years beginning at age 23 years.  Lung cancer screening. / Every year if you  are aged 16-80 years and have a 30-pack-year history of smoking and currently smoke or have quit within the past 15 years. Yearly screening is stopped once you have quit smoking for at least 15 years or develop a health problem that would prevent you from having lung cancer treatment.  Clinical breast exam.** / Every year after age 74 years.  BRCA-related cancer risk assessment.** / For women who have family members with a BRCA-related cancer (breast, ovarian, tubal, or peritoneal cancers).  Mammogram.** / Every year beginning at age 44 years and continuing for as long as you are in good health. Consult with your health care provider.  Pap test.** / Every 3 years starting at age 58 years through age 22 or 39 years with 3 consecutive normal Pap tests. Testing can be stopped between 65 and 70 years with 3 consecutive normal Pap tests and no abnormal Pap or HPV tests in the past 10 years.  HPV screening.** / Every 3 years from ages 64 years through ages 70 or 61 years with a history of 3 consecutive normal Pap tests. Testing can be stopped between 65 and 70 years with 3 consecutive normal Pap tests and no abnormal Pap or HPV tests in the past 10 years.  Fecal occult blood test (FOBT) of stool. / Every year beginning at age 40 years and continuing until age 27 years. You may not need to do this test if you get a colonoscopy every 10 years.  Flexible sigmoidoscopy or colonoscopy.** / Every 5 years for a flexible sigmoidoscopy or every 10 years for a colonoscopy beginning at age 7 years and continuing until age 32 years.  Hepatitis C blood test.** / For all people born from [birth year] through 1964 and any individual with known risks for hepatitis C.  Osteoporosis screening.** / A one-time screening for women ages 30 years and over and women at risk for fractures or osteoporosis.  Skin self-exam. / Monthly.  Influenza vaccine. / Every year.  Tetanus, diphtheria, and acellular pertussis (Tdap/Td)  vaccine.** / 1 dose of Td every 10 years.  Varicella vaccine.** / Consult your health care provider.  Zoster vaccine.** / 1 dose for adults aged 35 years or older.  Pneumococcal 13-valent conjugate (PCV13) vaccine.** / Consult your health care provider.  Pneumococcal polysaccharide (PPSV23) vaccine.** / 1 dose for all adults aged 46 years and older.  Meningococcal vaccine.** / Consult your health care provider.  Hepatitis A vaccine.** / Consult your health care provider.  Hepatitis B vaccine.** / Consult your health care provider.  Haemophilus influenzae type b (Hib) vaccine.** / Consult your health care provider. ** Family history and personal history of risk and conditions may change your health care provider's recommendations.   This information is not intended to replace advice given to you by your health care provider. Make sure you discuss any questions you have with your health care provider.   Document Released: 11/16/2001 Document Revised: 10/11/2014 Document Reviewed: 02/15/2011 Elsevier Interactive Patient Education [employer].

## 2015-10-14 NOTE — Progress Notes (Signed)
Pre visit review using our clinic review tool, if applicable. No additional management support is needed unless otherwise documented below in the visit note. 

## 2015-10-15 LAB — CBC WITH DIFFERENTIAL/PLATELET
BASOS ABS: 0.1 10*3/uL (ref 0.0–0.1)
Basophils Relative: 1.4 % (ref 0.0–3.0)
Eosinophils Absolute: 0.1 10*3/uL (ref 0.0–0.7)
Eosinophils Relative: 1.1 % (ref 0.0–5.0)
HEMATOCRIT: 42.8 % (ref 36.0–46.0)
Hemoglobin: 14.1 g/dL (ref 12.0–15.0)
LYMPHS PCT: 25.5 % (ref 12.0–46.0)
Lymphs Abs: 2 10*3/uL (ref 0.7–4.0)
MCHC: 32.9 g/dL (ref 30.0–36.0)
MCV: 86.7 fl (ref 78.0–100.0)
Monocytes Absolute: 0.3 10*3/uL (ref 0.1–1.0)
Monocytes Relative: 3.6 % (ref 3.0–12.0)
Neutro Abs: 5.4 10*3/uL (ref 1.4–7.7)
Neutrophils Relative %: 68.4 % (ref 43.0–77.0)
Platelets: 218 10*3/uL (ref 150.0–400.0)
RBC: 4.93 Mil/uL (ref 3.87–5.11)
RDW: 13.7 % (ref 11.5–15.5)
WBC: 7.9 10*3/uL (ref 4.0–10.5)

## 2015-10-15 LAB — COMPREHENSIVE METABOLIC PANEL
ALK PHOS: 111 U/L (ref 39–117)
ALT: 21 U/L (ref 0–35)
AST: 18 U/L (ref 0–37)
Albumin: 4.6 g/dL (ref 3.5–5.2)
BILIRUBIN TOTAL: 0.4 mg/dL (ref 0.2–1.2)
BUN: 15 mg/dL (ref 6–23)
CALCIUM: 9.6 mg/dL (ref 8.4–10.5)
CO2: 33 meq/L — AB (ref 19–32)
Chloride: 100 mEq/L (ref 96–112)
Creatinine, Ser: 0.59 mg/dL (ref 0.40–1.20)
GFR: 115.32 mL/min (ref >60.00)
Glucose, Bld: 170 mg/dL — ABNORMAL HIGH (ref 70–99)
Potassium: 3.7 mEq/L (ref 3.5–5.1)
Sodium: 141 mEq/L (ref 135–145)
TOTAL PROTEIN: 7.2 g/dL (ref 6.0–8.3)

## 2015-10-15 LAB — LIPID PANEL
CHOL/HDL RATIO: 3
Cholesterol: 169 mg/dL (ref 0–200)
HDL: 56.8 mg/dL (ref >39.00)
LDL Cholesterol: 101 mg/dL — ABNORMAL HIGH (ref 0–99)
NONHDL: 112.66
Triglycerides: 59 mg/dL (ref 0.0–149.0)
VLDL: 11.8 mg/dL (ref 0.0–40.0)

## 2015-10-15 LAB — TSH: TSH: 1.26 u[IU]/mL (ref 0.35–4.50)

## 2015-10-18 ENCOUNTER — Encounter: Payer: Self-pay | Admitting: Family Medicine

## 2015-10-18 DIAGNOSIS — E1165 Type 2 diabetes mellitus with hyperglycemia: Secondary | ICD-10-CM

## 2015-10-18 DIAGNOSIS — IMO0002 Reserved for concepts with insufficient information to code with codable children: Secondary | ICD-10-CM | POA: Insufficient documentation

## 2015-10-18 DIAGNOSIS — E1151 Type 2 diabetes mellitus with diabetic peripheral angiopathy without gangrene: Secondary | ICD-10-CM | POA: Insufficient documentation

## 2015-10-22 ENCOUNTER — Other Ambulatory Visit: Payer: Self-pay

## 2015-10-22 DIAGNOSIS — R7309 Other abnormal glucose: Secondary | ICD-10-CM

## 2015-10-23 ENCOUNTER — Other Ambulatory Visit (INDEPENDENT_AMBULATORY_CARE_PROVIDER_SITE_OTHER): Payer: BLUE CROSS/BLUE SHIELD

## 2015-10-23 DIAGNOSIS — R7309 Other abnormal glucose: Secondary | ICD-10-CM | POA: Diagnosis not present

## 2015-10-23 LAB — HEMOGLOBIN A1C: HEMOGLOBIN A1C: 5.3 % (ref 4.6–6.5)

## 2015-10-23 LAB — COMPREHENSIVE METABOLIC PANEL
ALBUMIN: 4.2 g/dL (ref 3.5–5.2)
ALT: 17 U/L (ref 0–35)
AST: 16 U/L (ref 0–37)
Alkaline Phosphatase: 102 U/L (ref 39–117)
BUN: 16 mg/dL (ref 6–23)
CHLORIDE: 103 meq/L (ref 96–112)
CO2: 31 mEq/L (ref 19–32)
CREATININE: 0.63 mg/dL (ref 0.40–1.20)
Calcium: 9.2 mg/dL (ref 8.4–10.5)
GFR: 106.91 mL/min (ref >60.00)
GLUCOSE: 98 mg/dL (ref 70–99)
POTASSIUM: 4.3 meq/L (ref 3.5–5.1)
SODIUM: 139 meq/L (ref 135–145)
Total Bilirubin: 0.5 mg/dL (ref 0.2–1.2)
Total Protein: 6.9 g/dL (ref 6.0–8.3)

## 2016-02-09 ENCOUNTER — Other Ambulatory Visit: Payer: Self-pay | Admitting: Family Medicine

## 2016-08-17 ENCOUNTER — Telehealth: Payer: Self-pay | Admitting: Family Medicine

## 2016-08-17 ENCOUNTER — Encounter: Payer: Self-pay | Admitting: Family

## 2016-08-17 ENCOUNTER — Other Ambulatory Visit (INDEPENDENT_AMBULATORY_CARE_PROVIDER_SITE_OTHER): Payer: BLUE CROSS/BLUE SHIELD

## 2016-08-17 ENCOUNTER — Ambulatory Visit (INDEPENDENT_AMBULATORY_CARE_PROVIDER_SITE_OTHER): Payer: BLUE CROSS/BLUE SHIELD | Admitting: Family

## 2016-08-17 VITALS — BP 120/90 | HR 61 | Temp 98.2°F | Resp 16 | Ht 64.25 in | Wt 193.0 lb

## 2016-08-17 DIAGNOSIS — I498 Other specified cardiac arrhythmias: Secondary | ICD-10-CM

## 2016-08-17 DIAGNOSIS — I10 Essential (primary) hypertension: Secondary | ICD-10-CM

## 2016-08-17 LAB — BASIC METABOLIC PANEL
BUN: 14 mg/dL (ref 6–23)
CO2: 33 mEq/L — ABNORMAL HIGH (ref 19–32)
CREATININE: 0.65 mg/dL (ref 0.40–1.20)
Calcium: 9.5 mg/dL (ref 8.4–10.5)
Chloride: 103 mEq/L (ref 96–112)
GFR: 102.77 mL/min (ref >60.00)
Glucose, Bld: 90 mg/dL (ref 70–99)
POTASSIUM: 4.1 meq/L (ref 3.5–5.1)
Sodium: 142 mEq/L (ref 135–145)

## 2016-08-17 NOTE — Telephone Encounter (Signed)
DOB: 06-20-1966 Initial Comment Caller states pt BP elevated 162/107 ish past 2 wks; 148/97 yesterday; coming down; having heart flutters, one yesterday lasted a little while and made her light headed. flu shot last week; used flonase for nose and took some aleve; Nurse Assessment Nurse: Vallery Sa, RN, Cathy Date/Time (Eastern Time): 08/17/2016 10:18:44 AM Confirm and document reason for call. If symptomatic, describe symptoms. You must click the next button to save text entered. ---Lattie Haw states she developed irregular heart beats yesterday that caused some dizziness. No severe breathing difficulty at this time. Her blood pressure was 149/107 this morning. No injury in the past 3 days. No chest pain. Alert and responsive. Has the patient traveled out of the country within the last 30 days? ---No Does the patient have any new or worsening symptoms? ---Yes Will a triage be completed? ---Yes Related visit to physician within the last 2 weeks? ---No Does the PT have any chronic conditions? (i.e. diabetes, asthma, etc.) ---Yes List chronic conditions. ---High Blood Pressure, GERD Is the patient pregnant or possibly pregnant? (Ask all females between the ages of 29-55) ---No Is this a behavioral health or substance abuse call? ---No Guidelines Guideline Title Affirmed Question Affirmed Notes Heart Rate and Heartbeat Questions [1] Heart beating very rapidly (e.g., > 140 / minute) AND [2] not present now (Exception: during exercise) High Blood Pressure BP # 160/100 Final Disposition User See PCP When Office is Open (within 3 days) [place], RN, [employer] Comments Scheduled 1:45pm appointment today with Elna Breslow, [place]. Referrals REFERRED TO PCP OFFICE Disagree/Comply: Comply

## 2016-08-17 NOTE — Telephone Encounter (Signed)
Patient called stating that her BP has been elevated and she has been having heart flutters... One made her light headed yesterday. Sent to [facility]. FYI

## 2016-08-17 NOTE — Telephone Encounter (Signed)
FYI, pt was placed on your schedule by [facility].

## 2016-08-17 NOTE — Assessment & Plan Note (Signed)
Blood pressure slightly elevated today although within goal with current regimen and no adverse side effects. Denies worse headache of life, changes in vision, and no symptoms of end organ damage noted upon exam. Continue to monitor blood pressure at home and follow low-sodium diet. Continue current dosage of lisinopril-hydrochlorothiazide and diltiazem. Follow-up if blood pressure remains elevated for additional treatment.

## 2016-08-17 NOTE — Patient Instructions (Signed)
Thank you for choosing [facility].  SUMMARY AND INSTRUCTIONS:  Your EKG looks great.  They will to schedule your Holter Monitor.  Continue to monitor your blood pressure at home and look at weekly averages.    Watch sodium intake.  Drink plenty of water.  Medication:  Please continue to take your medications as prescribed.   Your prescription(s) have been submitted to your pharmacy or been printed and provided for you. Please take as directed and contact our office if you believe you are having problem(s) with the medication(s) or have any questions.  Labs:  Please stop by the lab on the lower level of the building for your blood work. Your results will be released to [facility] (or called to you) after review, usually within 72 hours after test completion. If any changes need to be made, you will be notified at that same time.  1.) The lab is open from 7:30am to 5:30 pm Monday-Friday 2.) No appointment is necessary 3.) Fasting (if needed) is 6-8 hours after food and drink; black coffee and water are okay   Follow up:  If your symptoms worsen or fail to improve, please contact our office for further instruction, or in case of emergency go directly to the emergency room at the closest medical facility.     Palpitations A palpitation is the feeling that your heartbeat is irregular or is faster than normal. It may feel like your heart is fluttering or skipping a beat. Palpitations are usually not a serious problem. They may be caused by many things, including smoking, caffeine, alcohol, stress, and certain medicines. Although most causes of palpitations are not serious, palpitations can be a sign of a serious medical problem. In some cases, you may need further medical evaluation. Follow these instructions at home: Pay attention to any changes in your symptoms. Take these actions to help with your condition:  Avoid the following:  Caffeinated coffee, tea, soft drinks, diet  pills, and energy drinks.  Chocolate.  Alcohol.  Do not use any tobacco products, such as cigarettes, chewing tobacco, and e-cigarettes. If you need help quitting, ask your health care provider.  Try to reduce your stress and anxiety. Things that can help you relax include:  Yoga.  Meditation.  Physical activity, such as swimming, jogging, or walking.  Biofeedback. This is a method that helps you learn to use your mind to control things in your body, such as your heartbeats.  Get plenty of rest and sleep.  Take over-the-counter and prescription medicines only as told by your health care provider.  Keep all follow-up visits as told by your health care provider. This is important. Contact a health care provider if:  You continue to have a fast or irregular heartbeat after 24 hours.  Your palpitations occur more often. Get help right away if:  You have chest pain or shortness of breath.  You have a severe headache.  You feel dizzy or you faint. This information is not intended to replace advice given to you by your health care provider. Make sure you discuss any questions you have with your health care provider. Document Released: 09/17/2000 Document Revised: 02/23/2016 Document Reviewed: 06/05/2015 Elsevier Interactive Patient Education  2017 [employer].

## 2016-08-17 NOTE — Progress Notes (Signed)
Subjective:    Patient ID: Karina Hawkins, female    DOB: [birth year], 50 y.o.   MRN: [id number]  Chief Complaint  Patient presents with  . Hypertension    heart fluttering and elevated bp    HPI:  Karina Hawkins is a 50 y.o. female who  has a past medical history of Anxiety; Esophageal reflux; HTN (hypertension); Overweight(278.02); and Pneumonia. and presents today for an acute office visit.  This is a new problem. Associated symptom of heart fluttering and elevated blood pressure have been going on for about 2 weeks. Blood pressure is currently managed with lisinopril-hydrochlorothiazide and diltizaem. Reports taking the medications as prescribed and denies adverse side effects. With the heart palpitations she describes feeling dizzy. The palpitations lasted a few seconds. Does have significant history of panic attacks. The most recent episode occurred this morning. Denies changes in vision, worst headache of life or new symptoms of end organ damage.  BP Readings from Last 3 Encounters:  08/17/16 120/90  10/14/15 118/82  08/26/15 134/87     Allergies  Allergen Reactions  . Penicillins     Throat closes up  . Clarithromycin     Lots of nausea      Outpatient Medications Prior to Visit  Medication Sig Dispense Refill  . ALPRAZolam (XANAX) 0.25 MG tablet Take 1 tablet (0.25 mg total) by mouth 3 (three) times daily as needed. 90 tablet 0  . diltiazem (CARDIZEM CD) 240 MG 24 hr capsule Take 1 capsule (240 mg total) by mouth daily. 90 capsule 1  . lisinopril-hydrochlorothiazide (PRINZIDE,ZESTORETIC) 20-12.5 MG tablet TAKE 1 TABLET BY MOUTH DAILY. 90 tablet 1  . Multiple Vitamin (MULTIVITAMIN) tablet Take 1 tablet by mouth daily.      . pantoprazole (PROTONIX) 20 MG tablet Take 1 tablet (20 mg total) by mouth daily. (Patient taking differently: Take 20 mg by mouth as needed. ) 90 tablet 3   No facility-administered medications prior to visit.       Past Surgical History:    Procedure Laterality Date  . ANAL FISSURE REPAIR  1993   Dr Rosana Hoes  . FOOT SURGERY  2002   bunion repair  . TONSILLECTOMY  Age 32   w/ Adenoids  . TUBAL LIGATION        Past Medical History:  Diagnosis Date  . Anxiety   . Esophageal reflux   . HTN (hypertension)   . Overweight(278.02)   . Pneumonia     Review of Systems  Constitutional: Negative for chills and fever.  Eyes:       Negative for changes in vision  Respiratory: Negative for cough, chest tightness and wheezing.   Cardiovascular: Negative for chest pain, palpitations and leg swelling.  Neurological: Negative for dizziness, weakness and light-headedness.      Objective:    BP 120/90 (BP Location: Left Arm, Patient Position: Sitting, Cuff Size: Large)   Pulse 61   Temp 98.2 F (36.8 C) (Oral)   Resp 16   Ht 5' 4.25" (1.632 m)   Wt 193 lb (87.5 kg)   SpO2 97%   BMI 32.87 kg/m  Nursing note and vital signs reviewed.  Physical Exam  Constitutional: She is oriented to person, place, and time. She appears well-developed and well-nourished. No distress.  Cardiovascular: Normal rate, regular rhythm and intact distal pulses.  Exam reveals no gallop and no friction rub.   No murmur heard. Pulmonary/Chest: Effort normal and breath sounds normal.  Neurological: She is alert  and oriented to person, place, and time.  Skin: Skin is warm and dry.  Psychiatric: She has a normal mood and affect. Her behavior is normal. Judgment and thought content normal.       Assessment & Plan:   Problem List Items Addressed This Visit      Cardiovascular and Mediastinum   Essential hypertension    Blood pressure slightly elevated today although within goal with current regimen and no adverse side effects. Denies worse headache of life, changes in vision, and no symptoms of end organ damage noted upon exam. Continue to monitor blood pressure at home and follow low-sodium diet. Continue current dosage of  lisinopril-hydrochlorothiazide and diltiazem. Follow-up if blood pressure remains elevated for additional treatment.        Other   Fluttering heart - Primary    In office EKG shows normal sinus rhythm with no evidence of heart palpitations/fluttering. Obtain basic metabolic panel to rule out electrolyte disturbance. Refer to alter monitor to attempt Her symptoms. Does not appear to be cardiac related and may be anxiety driven. Continue to monitor and follow-up pending Holter monitor results.      Relevant Orders   EKG 12-Lead (Completed)   Basic Metabolic Panel (BMET)   Holter monitor - 48 hour       I am having Ms. Whitlatch maintain her multivitamin, pantoprazole, ALPRAZolam, lisinopril-hydrochlorothiazide, and diltiazem.   Follow-up: Return if symptoms worsen or fail to improve.  Mauricio Po, FNP

## 2016-08-17 NOTE — Assessment & Plan Note (Signed)
In office EKG shows normal sinus rhythm with no evidence of heart palpitations/fluttering. Obtain basic metabolic panel to rule out electrolyte disturbance. Refer to alter monitor to attempt Her symptoms. Does not appear to be cardiac related and may be anxiety driven. Continue to monitor and follow-up pending Holter monitor results.

## 2016-08-17 NOTE — Telephone Encounter (Signed)
Per [facility] Note, patient has been scheduled an appointment with Dr. Mauricio Po, FNP at 1:45 PM today.

## 2016-10-14 ENCOUNTER — Ambulatory Visit (INDEPENDENT_AMBULATORY_CARE_PROVIDER_SITE_OTHER): Payer: BLUE CROSS/BLUE SHIELD | Admitting: Family Medicine

## 2016-10-14 ENCOUNTER — Encounter: Payer: Self-pay | Admitting: Family Medicine

## 2016-10-14 VITALS — BP 118/76 | HR 70 | Temp 98.2°F | Resp 16 | Ht 64.5 in | Wt 192.8 lb

## 2016-10-14 DIAGNOSIS — I10 Essential (primary) hypertension: Secondary | ICD-10-CM

## 2016-10-14 DIAGNOSIS — R739 Hyperglycemia, unspecified: Secondary | ICD-10-CM | POA: Diagnosis not present

## 2016-10-14 DIAGNOSIS — Z0000 Encounter for general adult medical examination without abnormal findings: Secondary | ICD-10-CM

## 2016-10-14 LAB — LIPID PANEL
CHOLESTEROL: 165 mg/dL (ref 0–200)
HDL: 56.1 mg/dL (ref >39.00)
LDL CALC: 94 mg/dL (ref 0–99)
NONHDL: 108.42
Total CHOL/HDL Ratio: 3
Triglycerides: 73 mg/dL (ref 0.0–149.0)
VLDL: 14.6 mg/dL (ref 0.0–40.0)

## 2016-10-14 LAB — COMPREHENSIVE METABOLIC PANEL
ALK PHOS: 116 U/L (ref 39–117)
ALT: 23 U/L (ref 0–35)
AST: 22 U/L (ref 0–37)
Albumin: 4.6 g/dL (ref 3.5–5.2)
BUN: 17 mg/dL (ref 6–23)
CHLORIDE: 101 meq/L (ref 96–112)
CO2: 31 mEq/L (ref 19–32)
Calcium: 9.7 mg/dL (ref 8.4–10.5)
Creatinine, Ser: 0.64 mg/dL (ref 0.40–1.20)
GFR: 104.56 mL/min (ref >60.00)
GLUCOSE: 87 mg/dL (ref 70–99)
POTASSIUM: 4.2 meq/L (ref 3.5–5.1)
SODIUM: 139 meq/L (ref 135–145)
Total Bilirubin: 0.5 mg/dL (ref 0.2–1.2)
Total Protein: 7.3 g/dL (ref 6.0–8.3)

## 2016-10-14 LAB — HEMOGLOBIN A1C: HEMOGLOBIN A1C: 5.4 % (ref 4.6–6.5)

## 2016-10-14 LAB — CBC WITH DIFFERENTIAL/PLATELET
BASOS PCT: 0.2 % (ref 0.0–3.0)
Basophils Absolute: 0 10*3/uL (ref 0.0–0.1)
EOS ABS: 0.1 10*3/uL (ref 0.0–0.7)
EOS PCT: 0.9 % (ref 0.0–5.0)
HCT: 41.4 % (ref 36.0–46.0)
HEMOGLOBIN: 14 g/dL (ref 12.0–15.0)
LYMPHS ABS: 1.5 10*3/uL (ref 0.7–4.0)
Lymphocytes Relative: 22 % (ref 12.0–46.0)
MCHC: 33.9 g/dL (ref 30.0–36.0)
MCV: 85.4 fl (ref 78.0–100.0)
MONO ABS: 0.4 10*3/uL (ref 0.1–1.0)
Monocytes Relative: 5.3 % (ref 3.0–12.0)
NEUTROS PCT: 71.6 % (ref 43.0–77.0)
Neutro Abs: 4.9 10*3/uL (ref 1.4–7.7)
PLATELETS: 204 10*3/uL (ref 150.0–400.0)
RBC: 4.85 Mil/uL (ref 3.87–5.11)
RDW: 14 % (ref 11.5–15.5)
WBC: 6.8 10*3/uL (ref 4.0–10.5)

## 2016-10-14 LAB — POCT URINALYSIS DIPSTICK
BILIRUBIN UA: NEGATIVE
GLUCOSE UA: NEGATIVE
Ketones, UA: NEGATIVE
Leukocytes, UA: NEGATIVE
NITRITE UA: NEGATIVE
Protein, UA: NEGATIVE
RBC UA: NEGATIVE
SPEC GRAV UA: 1.025
UROBILINOGEN UA: NEGATIVE
pH, UA: 6

## 2016-10-14 LAB — TSH: TSH: 1.08 u[IU]/mL (ref 0.35–4.50)

## 2016-10-14 MED ORDER — LISINOPRIL-HYDROCHLOROTHIAZIDE 20-12.5 MG PO TABS: 1.0000 | ORAL_TABLET | Freq: Every day | ORAL | 3 refills | Status: DC

## 2016-10-14 NOTE — Patient Instructions (Signed)

## 2016-10-14 NOTE — Progress Notes (Signed)
Subjective:     Karina Hawkins is a 51 y.o. female and is here for a comprehensive physical exam. The patient reports no problems.--- pt was seen for palpitations in Nov--- she has not had any problems since.    Social History   Social History  . Marital status: Married    Spouse name: Lewis  . Number of children: 2  . Years of education: N/A   Occupational History  . levelor [employer]   Social History Main Topics  . Smoking status: Never Smoker  . Smokeless tobacco: Never Used  . Alcohol use 0.0 oz/week     Comment: socially 1x per month  . Drug use: No  . Sexual activity: Yes    Partners: Male   Other Topics Concern  . Not on file   Social History Narrative   Exercise--no   Health Maintenance  Topic Date Due  . HIV Screening  02/28/1982  . MAMMOGRAM  10/12/2017  . PAP SMEAR  09/22/2018  . TETANUS/TDAP  07/24/2019  . INFLUENZA VACCINE  Completed    The following portions of the patient's history were reviewed and updated as appropriate: She  has a past medical history of Anxiety; Esophageal reflux; HTN (hypertension); Hyperglycemia; Overweight(278.02); and Pneumonia. She  does not have any pertinent problems on file. She  has a past surgical history that includes Tonsillectomy (Age 29); Anal fissure repair (1993); Tubal ligation; and Foot surgery (2002). Her family history includes Breast cancer in her maternal grandmother; Diabetes in her mother; Emphysema in her maternal grandfather; Hyperlipidemia in her father and mother; Hypertension in her father, maternal grandfather, maternal grandmother, mother, paternal grandfather, and paternal grandmother; Rheum arthritis in her mother; Stroke in her mother. She  reports that she has never smoked. She has never used smokeless tobacco. She reports that she drinks alcohol. She reports that she does not use drugs. She has a current medication list which includes the following prescription(s): alprazolam, diltiazem,  lisinopril-hydrochlorothiazide, multivitamin, and pantoprazole. Current Outpatient Prescriptions on File Prior to Visit  Medication Sig Dispense Refill  . ALPRAZolam (XANAX) 0.25 MG tablet Take 1 tablet (0.25 mg total) by mouth 3 (three) times daily as needed. 90 tablet 0  . diltiazem (CARDIZEM CD) 240 MG 24 hr capsule Take 1 capsule (240 mg total) by mouth daily. 90 capsule 1  . lisinopril-hydrochlorothiazide (PRINZIDE,ZESTORETIC) 20-12.5 MG tablet TAKE 1 TABLET BY MOUTH DAILY. 90 tablet 1  . Multiple Vitamin (MULTIVITAMIN) tablet Take 1 tablet by mouth daily.      . pantoprazole (PROTONIX) 20 MG tablet Take 1 tablet (20 mg total) by mouth daily. (Patient taking differently: Take 20 mg by mouth as needed. ) 90 tablet 3   No current facility-administered medications on file prior to visit.    She is allergic to penicillins and clarithromycin..  Review of Systems Review of Systems  Constitutional: Negative for activity change, appetite change and fatigue.  HENT: Negative for hearing loss, congestion, tinnitus and ear discharge.  dentist q64m Eyes: Negative for visual disturbance (see optho q1y -- vision corrected to 20/20 with glasses).  Respiratory: Negative for cough, chest tightness and shortness of breath.   Cardiovascular: Negative for chest pain, palpitations and leg swelling.  Gastrointestinal: Negative for abdominal pain, diarrhea, constipation and abdominal distention.  Genitourinary: Negative for urgency, frequency, decreased urine volume and difficulty urinating.  Musculoskeletal: Negative for back pain, arthralgias and gait problem.  Skin: Negative for color change, pallor and rash.  Neurological: Negative for dizziness,  light-headedness, numbness and headaches.  Hematological: Negative for adenopathy. Does not bruise/bleed easily.  Psychiatric/Behavioral: Negative for suicidal ideas, confusion, sleep disturbance, self-injury, dysphoric mood, decreased concentration and agitation.        Objective:    BP 118/76 (BP Location: Right Arm, Cuff Size: Normal)   Pulse 70   Temp 98.2 F (36.8 C) (Oral)   Resp 16   Ht 5' 4.5" (1.638 m)   Wt 192 lb 12.8 oz (87.5 kg)   SpO2 96%   BMI 32.58 kg/m  General appearance: alert, cooperative, appears stated age and no distress Head: Normocephalic, without obvious abnormality, atraumatic Eyes: conjunctivae/corneas clear. PERRL, EOM's intact. Fundi benign. Ears: normal TM's and external ear canals both ears Nose: Nares normal. Septum midline. Mucosa normal. No drainage or sinus tenderness. Throat: lips, mucosa, and tongue normal; teeth and gums normal Neck: no adenopathy, no carotid bruit, no JVD, supple, symmetrical, trachea midline and thyroid not enlarged, symmetric, no tenderness/mass/nodules Back: symmetric, no curvature. ROM normal. No CVA tenderness. Lungs: clear to auscultation bilaterally Breasts: gyn Heart: regular rate and rhythm, S1, S2 normal, no murmur, click, rub or gallop Abdomen: soft, non-tender; bowel sounds normal; no masses,  no organomegaly Pelvic: deferred --gynExtremities: extremities normal, atraumatic, no cyanosis or edema Pulses: 2+ and symmetric Skin: Skin color, texture, turgor normal. No rashes or lesions Lymph nodes: Cervical, supraclavicular, and axillary nodes normal. Neurologic: Alert and oriented X 3, normal strength and tone. Normal symmetric reflexes. Normal coordination and gait    Assessment:    Healthy female exam.      Plan:  ghm utd Check labs   See After Visit Summary for Counseling Recommendations

## 2016-10-14 NOTE — Progress Notes (Signed)
Pre visit review using our clinic review tool, if applicable. No additional management support is needed unless otherwise documented below in the visit note. 

## 2016-10-22 ENCOUNTER — Other Ambulatory Visit: Payer: Self-pay | Admitting: Family Medicine

## 2017-01-06 ENCOUNTER — Other Ambulatory Visit: Payer: Self-pay | Admitting: Family Medicine

## 2017-04-14 ENCOUNTER — Ambulatory Visit: Payer: BLUE CROSS/BLUE SHIELD | Admitting: Family Medicine

## 2017-07-11 ENCOUNTER — Ambulatory Visit: Payer: BLUE CROSS/BLUE SHIELD | Admitting: Family Medicine

## 2017-07-14 ENCOUNTER — Ambulatory Visit (HOSPITAL_BASED_OUTPATIENT_CLINIC_OR_DEPARTMENT_OTHER)
Admission: RE | Admit: 2017-07-14 | Discharge: 2017-07-14 | Disposition: A | Discharge status: Home / Self Care | Payer: BLUE CROSS/BLUE SHIELD | Source: Ambulatory Visit | Attending: Family Medicine | Admitting: Family Medicine

## 2017-07-14 ENCOUNTER — Ambulatory Visit (INDEPENDENT_AMBULATORY_CARE_PROVIDER_SITE_OTHER): Payer: BLUE CROSS/BLUE SHIELD | Admitting: Family Medicine

## 2017-07-14 ENCOUNTER — Encounter: Payer: Self-pay | Admitting: Family Medicine

## 2017-07-14 VITALS — BP 124/80 | HR 74 | Temp 98.1°F | Resp 16 | Ht 64.5 in | Wt 194.0 lb

## 2017-07-14 DIAGNOSIS — M5442 Lumbago with sciatica, left side: Secondary | ICD-10-CM

## 2017-07-14 MED ORDER — TRAMADOL HCL 50 MG PO TABS: 50.0000 mg | ORAL_TABLET | Freq: Three times a day (TID) | ORAL | 0 refills | Status: DC | PRN

## 2017-07-14 MED ORDER — CYCLOBENZAPRINE HCL 10 MG PO TABS: 10.0000 mg | ORAL_TABLET | Freq: Three times a day (TID) | ORAL | 0 refills | Status: DC | PRN

## 2017-07-14 NOTE — Progress Notes (Signed)
Patient ID: Karina Hawkins, female    DOB: [birth year]  Age: 51 y.o. MRN: [id number]    Subjective:  Subjective  HPI Karina Hawkins presents for low back pain.  She remembers sneezing while showing her son how to ride a mountain bike and that was a month ago.  It is just not getting better.  Pain goes down to toes on L leg    Nothing makes it better.    Review of Systems  Constitutional: Negative for activity change, appetite change, fatigue and unexpected weight change.  Respiratory: Negative for cough and shortness of breath.   Cardiovascular: Negative for chest pain and palpitations.  Psychiatric/Behavioral: Negative for behavioral problems and dysphoric mood. The patient is not nervous/anxious.     History Past Medical History:  Diagnosis Date  . Anxiety   . Esophageal reflux   . HTN (hypertension)   . Hyperglycemia   . Overweight(278.02)   . Pneumonia     She has a past surgical history that includes Tonsillectomy (Age 14); Anal fissure repair (1993); Tubal ligation; and Foot surgery (2002).   Her family history includes Breast cancer in her maternal grandmother; Diabetes in her mother; Emphysema in her maternal grandfather; Hyperlipidemia in her father and mother; Hypertension in her father, maternal grandfather, maternal grandmother, mother, paternal grandfather, and paternal grandmother; Rheum arthritis in her mother; Stroke in her mother.She reports that she has never smoked. She has never used smokeless tobacco. She reports that she drinks alcohol. She reports that she does not use drugs.  Current Outpatient Prescriptions on File Prior to Visit  Medication Sig Dispense Refill  . ALPRAZolam (XANAX) 0.25 MG tablet Take 1 tablet (0.25 mg total) by mouth 3 (three) times daily as needed. 90 tablet 0  . diltiazem (CARDIZEM CD) 240 MG 24 hr capsule TAKE ONE CAPSULE BY MOUTH EVERY DAY 90 capsule 1  . lisinopril-hydrochlorothiazide (PRINZIDE,ZESTORETIC) 20-12.5 MG tablet Take 1 tablet  by mouth daily. 90 tablet 3  . Multiple Vitamin (MULTIVITAMIN) tablet Take 1 tablet by mouth daily.      . pantoprazole (PROTONIX) 20 MG tablet Take 1 tablet (20 mg total) by mouth daily. (Patient taking differently: Take 20 mg by mouth as needed. ) 90 tablet 3   No current facility-administered medications on file prior to visit.      Objective:  Objective  Physical Exam  Constitutional: She is oriented to person, place, and time. She appears well-developed and well-nourished.  HENT:  Head: Normocephalic and atraumatic.  Eyes: Conjunctivae and EOM are normal.  Neck: Normal range of motion. Neck supple. No JVD present. Carotid bruit is not present. No thyromegaly present.  Cardiovascular: Normal rate, regular rhythm and normal heart sounds.   No murmur heard. Pulmonary/Chest: Effort normal and breath sounds normal. No respiratory distress. She has no wheezes. She has no rales. She exhibits no tenderness.  Musculoskeletal: She exhibits no edema.       Left upper leg: She exhibits tenderness.  Neurological: She is alert and oriented to person, place, and time. No cranial nerve deficit or sensory deficit. Gait abnormal.  L hip flexor 2/5 strength Plantar flexion and ext 2/5 Toe flex/ ext weak dtr dec  2/5 knee ext/flex 2/5  Psychiatric: She has a normal mood and affect.  Nursing note and vitals reviewed.  BP 124/80 (BP Location: Right Arm, Patient Position: Sitting, Cuff Size: Normal)   Pulse 74   Temp 98.1 F (36.7 C) (Oral)   Resp 16   Ht  5' 4.5" (1.638 m)   Wt 194 lb (88 kg)   SpO2 96%   BMI 32.79 kg/m  Wt Readings from Last 3 Encounters:  07/14/17 194 lb (88 kg)  10/14/16 192 lb 12.8 oz (87.5 kg)  08/17/16 193 lb (87.5 kg)     Lab Results  Component Value Date   WBC 6.8 10/14/2016   HGB 14.0 10/14/2016   HCT 41.4 10/14/2016   PLT 204.0 10/14/2016   GLUCOSE 87 10/14/2016   CHOL 165 10/14/2016   TRIG 73.0 10/14/2016   HDL 56.10 10/14/2016   LDLCALC 94 10/14/2016    ALT 23 10/14/2016   AST 22 10/14/2016   NA 139 10/14/2016   K 4.2 10/14/2016   CL 101 10/14/2016   CREATININE 0.64 10/14/2016   BUN 17 10/14/2016   CO2 31 10/14/2016   TSH 1.08 10/14/2016   INR 1.08 07/03/2010   HGBA1C 5.4 10/14/2016    Dg Chest 2 View  Result Date: 11/11/2014 CLINICAL DATA:  Chest tightness since yesterday EXAM: CHEST  2 VIEW COMPARISON:  11/26/2012 FINDINGS: Normal heart size and mediastinal contours. No acute infiltrate or edema. No effusion or pneumothorax. No acute osseous findings. IMPRESSION: Negative chest. Electronically Signed   By: Jorje Guild M.D.   On: 11/11/2014 13:12     Assessment & Plan:  Plan  I am having Ms. Sittner start on cyclobenzaprine and traMADol. I am also having her maintain her multivitamin, pantoprazole, ALPRAZolam, lisinopril-hydrochlorothiazide, and diltiazem.  Meds ordered this encounter  Medications  . cyclobenzaprine (FLEXERIL) 10 MG tablet    Sig: Take 1 tablet (10 mg total) by mouth 3 (three) times daily as needed for muscle spasms.    Dispense:  30 tablet    Refill:  0  . traMADol (ULTRAM) 50 MG tablet    Sig: Take 1 tablet (50 mg total) by mouth every 8 (eight) hours as needed.    Dispense:  30 tablet    Refill:  0    Problem List Items Addressed This Visit    None    Visit Diagnoses    Acute left-sided low back pain with left-sided sciatica    -  Primary   Relevant Medications   cyclobenzaprine (FLEXERIL) 10 MG tablet   traMADol (ULTRAM) 50 MG tablet   Other Relevant Orders   DG Lumbar Spine Complete (Completed)   MR Lumbar Spine Wo Contrast      Follow-up: Return if symptoms worsen or fail to improve.  Ann Held, DO

## 2017-07-14 NOTE — Patient Instructions (Signed)

## 2017-07-21 ENCOUNTER — Ambulatory Visit (HOSPITAL_COMMUNITY)
Admission: RE | Admit: 2017-07-21 | Discharge: 2017-07-21 | Disposition: A | Discharge status: Home / Self Care | Payer: BLUE CROSS/BLUE SHIELD | Source: Ambulatory Visit | Attending: Family Medicine | Admitting: Family Medicine

## 2017-07-21 DIAGNOSIS — M5127 Other intervertebral disc displacement, lumbosacral region: Secondary | ICD-10-CM | POA: Diagnosis not present

## 2017-07-21 DIAGNOSIS — M5442 Lumbago with sciatica, left side: Secondary | ICD-10-CM

## 2017-07-21 DIAGNOSIS — M47896 Other spondylosis, lumbar region: Secondary | ICD-10-CM | POA: Insufficient documentation

## 2017-07-25 ENCOUNTER — Telehealth: Payer: Self-pay | Admitting: Family Medicine

## 2017-07-25 ENCOUNTER — Other Ambulatory Visit: Payer: Self-pay | Admitting: Family Medicine

## 2017-07-25 DIAGNOSIS — I10 Essential (primary) hypertension: Secondary | ICD-10-CM

## 2017-07-25 DIAGNOSIS — E278 Other specified disorders of adrenal gland: Secondary | ICD-10-CM

## 2017-07-25 DIAGNOSIS — M549 Dorsalgia, unspecified: Secondary | ICD-10-CM

## 2017-07-25 NOTE — Telephone Encounter (Signed)
See result notes. 

## 2017-07-25 NOTE — Telephone Encounter (Signed)
Pt returned call from Friday for results MRI. Pt also states she sees results in MyChart but does not understand them. Please return call to pt.

## 2017-07-26 ENCOUNTER — Other Ambulatory Visit (INDEPENDENT_AMBULATORY_CARE_PROVIDER_SITE_OTHER): Payer: BLUE CROSS/BLUE SHIELD

## 2017-07-26 DIAGNOSIS — I10 Essential (primary) hypertension: Secondary | ICD-10-CM

## 2017-07-26 LAB — BASIC METABOLIC PANEL
BUN: 18 mg/dL (ref 6–23)
CALCIUM: 9.7 mg/dL (ref 8.4–10.5)
CO2: 32 mEq/L (ref 19–32)
CREATININE: 0.61 mg/dL (ref 0.40–1.20)
Chloride: 103 mEq/L (ref 96–112)
GFR: 110.17 mL/min (ref >60.00)
Glucose, Bld: 96 mg/dL (ref 70–99)
Potassium: 4.2 mEq/L (ref 3.5–5.1)
SODIUM: 140 meq/L (ref 135–145)

## 2017-07-30 ENCOUNTER — Ambulatory Visit (HOSPITAL_BASED_OUTPATIENT_CLINIC_OR_DEPARTMENT_OTHER)
Admission: RE | Admit: 2017-07-30 | Discharge: 2017-07-30 | Disposition: A | Discharge status: Home / Self Care | Payer: BLUE CROSS/BLUE SHIELD | Source: Ambulatory Visit | Attending: Family Medicine | Admitting: Family Medicine

## 2017-07-30 DIAGNOSIS — E279 Disorder of adrenal gland, unspecified: Secondary | ICD-10-CM | POA: Insufficient documentation

## 2017-07-30 DIAGNOSIS — E278 Other specified disorders of adrenal gland: Secondary | ICD-10-CM

## 2017-07-30 MED ORDER — GADOBENATE DIMEGLUMINE 529 MG/ML IV SOLN
20.0000 mL | Freq: Once | INTRAVENOUS | Status: AC | PRN
  Administered 2017-07-30: 17 mL via INTRAVENOUS

## 2017-08-30 ENCOUNTER — Encounter: Payer: Self-pay | Admitting: Physical Therapy

## 2017-08-30 ENCOUNTER — Ambulatory Visit: Payer: BLUE CROSS/BLUE SHIELD | Attending: Neurosurgery | Admitting: Physical Therapy

## 2017-08-30 ENCOUNTER — Other Ambulatory Visit: Payer: Self-pay

## 2017-08-30 DIAGNOSIS — M6281 Muscle weakness (generalized): Secondary | ICD-10-CM | POA: Diagnosis present

## 2017-08-30 DIAGNOSIS — M5442 Lumbago with sciatica, left side: Secondary | ICD-10-CM | POA: Diagnosis present

## 2017-08-30 DIAGNOSIS — R29898 Other symptoms and signs involving the musculoskeletal system: Secondary | ICD-10-CM | POA: Diagnosis present

## 2017-08-30 NOTE — Therapy (Signed)
[facility] [address]  [place], [place], [address] Phone: [phone]   Fax:  [phone]  Physical Therapy Evaluation  Patient Details  Name: Karina Hawkins MRN: [id number] Date of Birth: August 31, 1966 Referring Provider: Dr. Consuella Lose   Encounter Date: 08/30/2017  PT End of Session - 08/30/17 1118    Visit Number  1    Number of Visits  12    Date for PT Re-Evaluation  10/11/17    PT Start Time  0936    PT Stop Time  1021    PT Time Calculation (min)  45 min    Activity Tolerance  Patient tolerated treatment well    Behavior During Therapy  [facility] for tasks assessed/performed       Past Medical History:  Diagnosis Date  . Anxiety   . Esophageal reflux   . HTN (hypertension)   . Hyperglycemia   . Overweight(278.02)   . Pneumonia     Past Surgical History:  Procedure Laterality Date  . ANAL FISSURE REPAIR  1993   Dr Rosana Hoes  . FOOT SURGERY  2002   bunion repair  . TONSILLECTOMY  Age 41   w/ Adenoids  . TUBAL LIGATION      There were no vitals filed for this visit.   Subjective Assessment - 08/30/17 1119    Subjective  Patient reporting she was riding a bike with her daughter in September when she sneezed and jolted forward on the bike. Patient has been having back pain with radiating symptoms ever since, however has had significant relief and feels much better over the past week. Patient saw neurosurgeon last week and also had an MRI in October with no significant results. Patient reports her pain is worse at night after a long day of sitting at work and she feels very stiff in the mornings. Patient looked up sciatica on her own and has been doing stretching intermittently to B glute musculature. Patient currently reporting no pain at the moment with occassional episodes of shooting pain that "feels like a toothache down the leg".       Limitations  Sitting;Lifting    Diagnostic tests  MRI    Patient Stated Goals  return to normal activity level without pain    Currently in Pain?  No/denies         [facility] PT Assessment - 08/30/17 0941      Assessment   Medical Diagnosis  Lumbar radiculopathy    Referring Provider  Dr. Consuella Lose    Onset Date/Surgical Date  -- ~10 weeks ago    Next MD Visit  prn    Prior Therapy  no      Precautions   Precautions  None      Restrictions   Weight Bearing Restrictions  No      Balance Screen   Has the patient fallen in the past 6 months  No    Has the patient had a decrease in activity level because of a fear of falling?   No    Is the patient reluctant to leave their home because of a fear of falling?   No      Home Social worker  Private residence    Living Arrangements  Spouse/significant other;Children      Prior Function   Level of Independence  Independent    Vocation  Full time employment    Vocation Requirements  sitting,  traveling    Leisure  take care of kids, go to gym       Cognition   Overall Cognitive Status  Within Functional Limits for tasks assessed      Observation/Other Assessments   Focus on Therapeutic Outcomes (FOTO)   Lumbar: 60 (40% limited, 27% predicted)      Sensation   Light Touch  Appears Intact      Coordination   Gross Motor Movements are Fluid and Coordinated  Yes      Functional Tests   Functional tests  Squat      Squat   Comments  pain 2/10, good technique      Posture/Postural Control   Posture/Postural Control  No significant limitations      ROM / Strength   AROM / PROM / Strength  AROM;Strength      AROM   Overall AROM   Within functional limits for tasks performed    Overall AROM Comments  lumbar screen      Strength   Strength Assessment Site  Hip;Knee    Right/Left Hip  Right;Left    Right Hip Flexion  4+/5    Right Hip ABduction  4-/5    Right Hip ADduction  4/5    Left Hip Flexion  4-/5 shooting pain down leg after    Left Hip  ABduction  4-/5 pain in L hip joint    Left Hip ADduction  3+/5    Right/Left Knee  Right;Left    Right Knee Flexion  4+/5    Right Knee Extension  4+/5    Left Knee Flexion  4-/5 spasticity    Left Knee Extension  4/5      Flexibility   Soft Tissue Assessment /Muscle Length  yes    Hamstrings  no limitations    Piriformis  25% limited on L      Palpation   Palpation comment  TTP with CPA to L2-5, TTP at L SIJ, tenderness at L piriformis/hip joint             Objective measurements completed on examination: See above findings.      [facility] Adult PT Treatment/Exercise - 08/30/17 0941      Exercises   Exercises  Lumbar;Knee/Hip      Lumbar Exercises: Stretches   Press Ups  5 reps    Piriformis Stretch  3 reps;30 seconds    Piriformis Stretch Limitations  figure 4 pull to chest      Lumbar Exercises: Supine   Bridge  10 reps      Knee/Hip Exercises: Supine   Straight Leg Raises  AROM;Right;Left;10 reps      Knee/Hip Exercises: Sidelying   Hip ABduction  AROM;Left;10 reps             PT Education - 08/30/17 1118    Education provided  Yes    Education Details  exam findings, HEP, POC    Person(s) Educated  Patient    Methods  Explanation;Demonstration;Handout    Comprehension  Verbalized understanding;Returned demonstration       PT Short Term Goals - 08/30/17 1134      PT SHORT TERM GOAL #1   Title  Patient to be independent with initial HEP    Status  New    Target Date  09/13/17        PT Long Term Goals - 08/30/17 1134      PT LONG TERM GOAL #1   Title  Patient  to be independent with advanced HEP    Status  New    Target Date  10/11/17      PT LONG TERM GOAL #2   Title  Patient to improve gross BLE strength to 4+/5 to improve functional mobility    Status  New    Target Date  10/11/17      PT LONG TERM GOAL #3   Title  Patient to report ability to sit at work for >/= 1hr without pain limiting    Status  New    Target Date   10/11/17      PT LONG TERM GOAL #4   Title  Patient to report ability to restart and maintain aerobic/strengthening program at gym >/= 3x per week    Status  New    Target Date  10/11/17             Plan - 08/30/17 1127    Clinical Impression Statement  Mrs. Coppin is 51 y.o female presenting to [place] with primary complaint of low back pain with radiating pain. Pain has greatly improved since onset in September, however patient continues to report pain with daily activities and occassional radiating symptoms. Patient is having difficulty at work which requires prolonged bouts of sitting. Patient demonstrating significant weakness in B LE with weakness in L > R. Patient TTP in L glute/piriformis musculature, L hip joint area and lumbar vertebra/SIJ. Patient would benefit from skilled PT serives to address strength deficits and muscle tightness/tenderness in order to improve patient functional mobility and QOL.    Clinical Presentation  Stable    Clinical Decision Making  Low    Rehab Potential  Good    PT Frequency  2x / week    PT Duration  6 weeks    PT Treatment/Interventions  ADLs/Self Care Home Management;Cryotherapy;Electrical Stimulation;Iontophoresis 4mg /ml Dexamethasone;Moist Heat;Traction;Ultrasound;Therapeutic exercise;Therapeutic activities;Functional mobility training;Neuromuscular re-education;Patient/family education;Passive range of motion;Manual techniques;Dry needling;Taping;Vasopneumatic Device    PT Next Visit Plan  reassess tenderness in L glute/piriformis, begin B LE strengthening program    Consulted and Agree with Plan of Care  Patient       Patient will benefit from skilled therapeutic intervention in order to improve the following deficits and impairments:  Pain, Decreased activity tolerance, Decreased endurance, Decreased range of motion, Decreased strength, Decreased mobility  Visit Diagnosis: Acute left-sided low back pain with left-sided sciatica  Muscle  weakness (generalized)  Other symptoms and signs involving the musculoskeletal system     Problem List Patient Active Problem List   Diagnosis Date Noted  . Fluttering heart 08/17/2016  . DM (diabetes mellitus) type II uncontrolled, periph vascular disorder ([place]) 10/18/2015  . Plantar fasciitis, right 09/02/2015  . Hiatal hernia with gastroesophageal reflux 01/06/2015  . Sebaceous cyst, mid thoracic back 03/24/2012  . Obesity (BMI 30-39.9) 10/23/2009  . Anxiety state 10/04/2007  . Essential hypertension 10/04/2007    Mickle Asper, SPT 08/30/17 4:04 PM    [facility] [address]  [place], [place], [address] Phone: [phone]   Fax:  [phone]  Name: CORALEIGH SHEERAN MRN: [id number] Date of Birth: [birth year]

## 2017-08-30 NOTE — Patient Instructions (Signed)
Piriformis Stretch   Lying on back, pull right knee toward opposite shoulder. Hold __30__ seconds. Repeat __3__ times.  Piriformis Stretch, Supine   Lie supine, one ankle crossed onto opposite knee. Holding bottom leg behind knee, gently pull legs toward chest until stretch is felt in buttock of top leg. Hold __30_ seconds. For deeper stretch gently push top knee away from body.  Repeat __3_ times per session.   On Elbows (Prone)   Rise up on elbows as high as possible, keeping hips on floor. Hold __10-15__ seconds. Repeat __5-10__ times per set. Do __2__ sets per session.   Bridge   Lie back, legs bent. Inhale, pressing hips up. Keeping ribs in, lengthen lower back. Exhale, rolling down along spine from top. Repeat __15__ times. Do __2__ sessions per day.  ABDUCTION: Side-Lying (Active)   Lie on right side, top leg straight. Raise top leg as far as possible. Use _-_ lbs. Complete __2_ sets of __15_ repetitions.   Strengthening: Straight Leg Raise    Tighten muscles on front of left thigh, then lift leg __~8__ inches from surface, keeping knee locked.  Repeat __15__ times per set. Do _2___ sets per session.

## 2017-09-06 ENCOUNTER — Encounter: Payer: Self-pay | Admitting: Physical Therapy

## 2017-09-06 ENCOUNTER — Ambulatory Visit: Payer: BLUE CROSS/BLUE SHIELD | Attending: Neurosurgery | Admitting: Physical Therapy

## 2017-09-06 DIAGNOSIS — R29898 Other symptoms and signs involving the musculoskeletal system: Secondary | ICD-10-CM

## 2017-09-06 DIAGNOSIS — M6281 Muscle weakness (generalized): Secondary | ICD-10-CM | POA: Diagnosis present

## 2017-09-06 DIAGNOSIS — M5442 Lumbago with sciatica, left side: Secondary | ICD-10-CM

## 2017-09-06 NOTE — Therapy (Signed)
[facility] [address]  [place], [place], [address] Phone: [phone]   Fax:  [phone]  Physical Therapy Treatment  Patient Details  Name: Karina Hawkins MRN: [id number] Date of Birth: 08/14/1966 Referring Provider: Dr. Consuella Lose   Encounter Date: 09/06/2017  PT End of Session - 09/06/17 1106    Visit Number  2    Number of Visits  12    Date for PT Re-Evaluation  10/11/17    PT Start Time  1014    PT Stop Time  1102    PT Time Calculation (min)  48 min    Activity Tolerance  Patient tolerated treatment well    Behavior During Therapy  [facility] for tasks assessed/performed       Past Medical History:  Diagnosis Date  . Anxiety   . Esophageal reflux   . HTN (hypertension)   . Hyperglycemia   . Overweight(278.02)   . Pneumonia     Past Surgical History:  Procedure Laterality Date  . ANAL FISSURE REPAIR  1993   Dr Rosana Hoes  . FOOT SURGERY  2002   bunion repair  . TONSILLECTOMY  Age 78   w/ Adenoids  . TUBAL LIGATION      There were no vitals filed for this visit.  Subjective Assessment - 09/06/17 1017    Subjective  Patient reporting improvements in pain and function since initial visit. Traveled for work at the end of last week and felt a lot better with sitting and walking.     Currently in Pain?  No/denies                      [facility] Adult PT Treatment/Exercise - 09/06/17 1019      Lumbar Exercises: Aerobic   Stationary Bike  L2 x 6      Lumbar Exercises: Machines for Strengthening   Cybex Knee Extension  B con/ R/L ecc; 10 reps each; 20#    Cybex Knee Flexion  B con/ R/L ecc; 10 reps each; 20#      Knee/Hip Exercises: Standing   Step Down  Right;Left;15 reps;Hand Hold: 2;Step Height: 6"    Wall Squat  15 reps;3 seconds ball squeeze    Other Standing Knee Exercises  side stepping; red tband; 2 x 21ft each way      Knee/Hip Exercises: Supine   Other Supine Knee/Hip  Exercises  straight leg bridge + HS curl on peanut ball; 15 reps      Knee/Hip Exercises: Sidelying   Hip ABduction  Right;Left;15 reps 2#    Hip ADduction  Right;Left;15 reps 2#               PT Short Term Goals - 09/06/17 1111      PT SHORT TERM GOAL #1   Title  Patient to be independent with initial HEP    Status  On-going    Target Date  09/13/17        PT Long Term Goals - 09/06/17 1111      PT LONG TERM GOAL #1   Title  Patient to be independent with advanced HEP    Status  On-going    Target Date  10/11/17      PT LONG TERM GOAL #2   Title  Patient to improve gross BLE strength to 4+/5 to improve functional mobility    Status  On-going    Target Date  10/11/17  PT LONG TERM GOAL #3   Title  Patient to report ability to sit at work for >/= 1hr without pain limiting    Status  On-going    Target Date  10/11/17      PT LONG TERM GOAL #4   Title  Patient to report ability to restart and maintain aerobic/strengthening program at gym >/= 3x per week    Status  On-going    Target Date  10/11/17            Plan - 09/06/17 1106    Clinical Impression Statement  Patient doing well since initial visit and reporting significant relief with good compliance to HEP. Focused today's session on LE strengthening with patient tolerating all activities well. Patient feeling as though she has improved enough to transition to home program and return to normal gym acticities on her own. Will plan to follow up with patient at next visit and establish comprehensive HEP if patient continuing to feel improvements.     PT Treatment/Interventions  ADLs/Self Care Home Management;Cryotherapy;Electrical Stimulation;Iontophoresis 4mg /ml Dexamethasone;Moist Heat;Traction;Ultrasound;Therapeutic exercise;Therapeutic activities;Functional mobility training;Neuromuscular re-education;Patient/family education;Passive range of motion;Manual techniques;Dry needling;Taping;Vasopneumatic  Device    Consulted and Agree with Plan of Care  Patient       Patient will benefit from skilled therapeutic intervention in order to improve the following deficits and impairments:  Pain, Decreased activity tolerance, Decreased endurance, Decreased range of motion, Decreased strength, Decreased mobility  Visit Diagnosis: Acute left-sided low back pain with left-sided sciatica  Muscle weakness (generalized)  Other symptoms and signs involving the musculoskeletal system     Problem List Patient Active Problem List   Diagnosis Date Noted  . Fluttering heart 08/17/2016  . DM (diabetes mellitus) type II uncontrolled, periph vascular disorder ([place]) 10/18/2015  . Plantar fasciitis, right 09/02/2015  . Hiatal hernia with gastroesophageal reflux 01/06/2015  . Sebaceous cyst, mid thoracic back 03/24/2012  . Obesity (BMI 30-39.9) 10/23/2009  . Anxiety state 10/04/2007  . Essential hypertension 10/04/2007     Mickle Asper, SPT 09/06/17 11:14 AM    [facility] [address]  [place], [place], [address] Phone: [phone]   Fax:  [phone]  Name: Karina Hawkins MRN: [id number] Date of Birth: [birth year]

## 2017-09-08 ENCOUNTER — Encounter: Payer: Self-pay | Admitting: Physical Therapy

## 2017-09-08 ENCOUNTER — Ambulatory Visit: Payer: BLUE CROSS/BLUE SHIELD | Admitting: Physical Therapy

## 2017-09-08 DIAGNOSIS — M5442 Lumbago with sciatica, left side: Secondary | ICD-10-CM | POA: Diagnosis not present

## 2017-09-08 DIAGNOSIS — R29898 Other symptoms and signs involving the musculoskeletal system: Secondary | ICD-10-CM

## 2017-09-08 DIAGNOSIS — M6281 Muscle weakness (generalized): Secondary | ICD-10-CM

## 2017-09-08 NOTE — Patient Instructions (Signed)
Lower Abdominal (Dead Bug): Strength    Lie on back, legs in air. Bend and straighten legs while maintaining abdominal tension. Hold each position _3__ seconds. Repeat __10_ times each leg.    Bird Dog Pose - Intermediate    Keep pelvis stable. From table pose, step one leg back to plank pose. Reach opposite arm forward, back foot off floor.  Hold for __3__ seconds. Return arm and leg at same rate. Repeat __10__ times each side, alternating sides.   Band Walk: Side Stepping    Tie band around legs, just above knees. Step to one side, continuing for 20-71ft. Repeat on other side.  Bilateral Isometric Hip Flexion    Tighten stomach and raise both knees to outstretched arms. Push gently, keeping arms straight, trunk rigid. Hold __5__ seconds. Repeat __10__ times per set. Do __2__ sets per session.          Place band around ankles. Taking large steps forward for 20-30 ft. Repeat going backwards. Do 2-3 sets each direction.     FUNCTIONAL MOBILITY: Marching (Therapy Ball)    Sit on therapy ball. Raise left leg then right to march in place. Alternate. _15__ reps per set. Add alternating arms for next set.     Squat    In shoulder width stance, anchor tubing under feet. Palms forward at shoulder height. Squat, keeping back straight. Repeat _10_ times per set. Do _2_ sets per session.    Forward Walking    Step forward one leg. Drop opposite knee to the floor. Drop from hips. Front shin vertical. Opposite arm forward. Head and chest up. Push from hips and back foot; cycle through to opposite leg forward, switching arms. Left then right is one rep. Do _2__ sets __10_ reps.

## 2017-09-08 NOTE — Therapy (Addendum)
[facility] [address]  [place], [place], [address] Phone: [phone]   Fax:  [phone]  Physical Therapy Treatment  Patient Details  Name: Karina Hawkins MRN: [id number] Date of Birth: May 06, 1966 Referring Provider: Dr. Consuella Lose   Encounter Date: 09/08/2017  PT End of Session - 09/08/17 1035    Visit Number  3    Number of Visits  12    Date for PT Re-Evaluation  10/11/17    PT Start Time  0846    PT Stop Time  0930    PT Time Calculation (min)  44 min    Activity Tolerance  Patient tolerated treatment well    Behavior During Therapy  [facility] for tasks assessed/performed       Past Medical History:  Diagnosis Date  . Anxiety   . Esophageal reflux   . HTN (hypertension)   . Hyperglycemia   . Overweight(278.02)   . Pneumonia     Past Surgical History:  Procedure Laterality Date  . ANAL FISSURE REPAIR  1993   Dr Rosana Hoes  . FOOT SURGERY  2002   bunion repair  . TONSILLECTOMY  Age 66   w/ Adenoids  . TUBAL LIGATION      There were no vitals filed for this visit.  Subjective Assessment - 09/08/17 1032    Subjective  Patient continuing to feel improvements in low back pain with the exception of a minor twinge in L buttock/thigh intermittenly. Patient feels she is doing well with everything and would like to transition to home program after today's session.     Currently in Pain?  No/denies                      [facility] Adult PT Treatment/Exercise - 09/08/17 0900      Lumbar Exercises: Seated   Long Arc Quad on [place]  Right;Left;15 reps    Hip Flexion on Ball  Right;Left;15 reps      Lumbar Exercises: Supine   Dead Bug  10 reps;2 seconds 2 sets    Isometric Hip Flexion  10 reps;5 seconds      Lumbar Exercises: Quadruped   Opposite Arm/Leg Raise  Right arm/Left leg;Left arm/Right leg;10 reps;2 seconds      Knee/Hip Exercises: Standing   Lunge Walking - Round Trips  2 x 28f     Other Standing Knee Exercises  monster walk fwd/bwd; 2 x 367feach             PT Education - 09/08/17 1034    Education provided  Yes    Education Details  advanced HEP    Person(s) Educated  Patient    Methods  Explanation;Demonstration;Handout    Comprehension  Verbalized understanding;Returned demonstration       PT Short Term Goals - 09/06/17 1111      PT SHORT TERM GOAL #1   Title  Patient to be independent with initial HEP    Status  On-going    Target Date  09/13/17        PT Long Term Goals - 09/06/17 1111      PT LONG TERM GOAL #1   Title  Patient to be independent with advanced HEP    Status  On-going    Target Date  10/11/17      PT LONG TERM GOAL #2   Title  Patient to improve gross BLE strength to 4+/5 to improve functional  mobility    Status  On-going    Target Date  10/11/17      PT LONG TERM GOAL #3   Title  Patient to report ability to sit at work for >/= 1hr without pain limiting    Status  On-going    Target Date  10/11/17      PT LONG TERM GOAL #4   Title  Patient to report ability to restart and maintain aerobic/strengthening program at gym >/= 3x per week    Status  On-going    Target Date  10/11/17            Plan - 09/08/17 1035    Clinical Impression Statement  Patient requesting to transition to home program following today's visit, so focused today's treatment on exercises for patient to continue at home. Incorporated core activation and endurace as well as proximal hip strengthening. Patient demonstrating proper technique and good understanding with all exercises. Advised patient to perform exercises to tolerance, continue with stretching daily and follow up with PT as needed.     PT Treatment/Interventions  ADLs/Self Care Home Management;Cryotherapy;Electrical Stimulation;Iontophoresis 83m/ml Dexamethasone;Moist Heat;Traction;Ultrasound;Therapeutic exercise;Therapeutic activities;Functional mobility training;Neuromuscular  re-education;Patient/family education;Passive range of motion;Manual techniques;Dry needling;Taping;Vasopneumatic Device    Consulted and Agree with Plan of Care  Patient       Patient will benefit from skilled therapeutic intervention in order to improve the following deficits and impairments:  Pain, Decreased activity tolerance, Decreased endurance, Decreased range of motion, Decreased strength, Decreased mobility  Visit Diagnosis: Acute left-sided low back pain with left-sided sciatica  Muscle weakness (generalized)  Other symptoms and signs involving the musculoskeletal system     Problem List Patient Active Problem List   Diagnosis Date Noted  . Fluttering heart 08/17/2016  . DM (diabetes mellitus) type II uncontrolled, periph vascular disorder ([place] 10/18/2015  . Plantar fasciitis, right 09/02/2015  . Hiatal hernia with gastroesophageal reflux 01/06/2015  . Sebaceous cyst, mid thoracic back 03/24/2012  . Obesity (BMI 30-39.9) 10/23/2009  . Anxiety state 10/04/2007  . Essential hypertension 10/04/2007     KMickle Asper SPT 09/08/17 10:40 AM   PHYSICAL THERAPY DISCHARGE SUMMARY  Visits from Start of Care: 3  Current functional level related to goals / functional outcomes: See above   Remaining deficits: See above; little time to make improvements as patient only presenting to 3 visits - does report improvement in pain however   Education / Equipment: HEP  Plan: Patient agrees to discharge.  Patient goals were partially met. Patient is being discharged due to the patient's request.  ?????    SLanney Gins PT, DPT 12/13/17 9:04 AM   [facility] [place] [address]Phone: [phone]  Fax:  [phone] Name: Karina BOODYMRN: [id number] of Birth: [birth year]

## 2017-09-09 ENCOUNTER — Ambulatory Visit: Payer: BLUE CROSS/BLUE SHIELD

## 2017-11-19 ENCOUNTER — Other Ambulatory Visit: Payer: Self-pay | Admitting: Family Medicine

## 2018-01-09 ENCOUNTER — Other Ambulatory Visit: Payer: Self-pay

## 2018-01-09 DIAGNOSIS — I10 Essential (primary) hypertension: Secondary | ICD-10-CM

## 2018-01-09 MED ORDER — LISINOPRIL-HYDROCHLOROTHIAZIDE 20-12.5 MG PO TABS: 1.0000 | ORAL_TABLET | Freq: Every day | ORAL | 3 refills | Status: DC

## 2018-03-12 ENCOUNTER — Other Ambulatory Visit: Payer: Self-pay | Admitting: Family Medicine

## 2018-04-28 ENCOUNTER — Encounter: Payer: Self-pay | Admitting: Family Medicine

## 2018-04-28 ENCOUNTER — Ambulatory Visit (INDEPENDENT_AMBULATORY_CARE_PROVIDER_SITE_OTHER): Payer: BLUE CROSS/BLUE SHIELD | Admitting: Family Medicine

## 2018-04-28 VITALS — BP 132/90 | HR 94 | Temp 98.2°F | Ht 64.5 in | Wt 192.0 lb

## 2018-04-28 DIAGNOSIS — M5441 Lumbago with sciatica, right side: Secondary | ICD-10-CM | POA: Diagnosis not present

## 2018-04-28 DIAGNOSIS — F411 Generalized anxiety disorder: Secondary | ICD-10-CM

## 2018-04-28 MED ORDER — ALPRAZOLAM 0.25 MG PO TABS: 0.2500 mg | ORAL_TABLET | Freq: Three times a day (TID) | ORAL | 0 refills | Status: DC | PRN

## 2018-04-28 MED ORDER — MELOXICAM 15 MG PO TABS: 15.0000 mg | ORAL_TABLET | Freq: Every day | ORAL | 0 refills | Status: DC

## 2018-04-28 NOTE — Patient Instructions (Addendum)

## 2018-04-28 NOTE — Progress Notes (Addendum)
Patient ID: Karina Hawkins, female    DOB: 12-Dec-1965  Age: 52 y.o. MRN: [id number]    Subjective:  Subjective  HPI Karina Hawkins presents for inc back pain on R side-- she saw NS and an MRI was ordered   Review of Systems  Constitutional: Negative for appetite change, diaphoresis, fatigue and unexpected weight change.  Eyes: Negative for pain, redness and visual disturbance.  Respiratory: Negative for cough, chest tightness, shortness of breath and wheezing.   Cardiovascular: Negative for chest pain, palpitations and leg swelling.  Endocrine: Negative for cold intolerance, heat intolerance, polydipsia, polyphagia and polyuria.  Genitourinary: Negative for difficulty urinating, dysuria and frequency.  Musculoskeletal: Positive for back pain and gait problem.  Neurological: Positive for weakness and numbness. Negative for dizziness, light-headedness and headaches.    History Past Medical History:  Diagnosis Date  . Anxiety   . Esophageal reflux   . HTN (hypertension)   . Hyperglycemia   . Overweight(278.02)   . Pneumonia     She has a past surgical history that includes Tonsillectomy (Age 78); Anal fissure repair (1993); Tubal ligation; and Foot surgery (2002).   Her family history includes Breast cancer in her maternal grandmother; Diabetes in her mother; Emphysema in her maternal grandfather; Hyperlipidemia in her father and mother; Hypertension in her father, maternal grandfather, maternal grandmother, mother, paternal grandfather, and paternal grandmother; Rheum arthritis in her mother; Stroke in her mother.She reports that she has never smoked. She has never used smokeless tobacco. She reports that she drinks alcohol. She reports that she does not use drugs.  Current Outpatient Medications on File Prior to Visit  Medication Sig Dispense Refill  . cyclobenzaprine (FLEXERIL) 10 MG tablet Take 1 tablet (10 mg total) by mouth 3 (three) times daily as needed for muscle spasms. 30  tablet 0  . diltiazem (CARDIZEM CD) 240 MG 24 hr capsule TAKE 1 CAPSULE BY MOUTH EVERY DAY 90 capsule 0  . gabapentin (NEURONTIN) 300 MG capsule TAKE 1 CAPSULE BY MOUTH THREE TIMES A DAY  0  . lisinopril-hydrochlorothiazide (PRINZIDE,ZESTORETIC) 20-12.5 MG tablet Take 1 tablet by mouth daily. 90 tablet 3  . meloxicam (MOBIC) 15 MG tablet Take 15 mg by mouth daily as needed.  0  . Multiple Vitamin (MULTIVITAMIN) tablet Take 1 tablet by mouth daily.      . traMADol (ULTRAM) 50 MG tablet Take 1 tablet (50 mg total) by mouth every 8 (eight) hours as needed. 30 tablet 0   No current facility-administered medications on file prior to visit.      Objective:  Objective  Physical Exam  Constitutional: She is oriented to person, place, and time. She appears well-developed and well-nourished.  HENT:  Head: Normocephalic and atraumatic.  Eyes: Conjunctivae and EOM are normal.  Neck: Normal range of motion. Neck supple. No JVD present. Carotid bruit is not present. No thyromegaly present.  Cardiovascular: Normal rate, regular rhythm and normal heart sounds.  No murmur heard. Pulmonary/Chest: Effort normal and breath sounds normal. No respiratory distress. She has no wheezes. She has no rales. She exhibits no tenderness.  Musculoskeletal: She exhibits no edema.  Neurological: She is alert and oriented to person, place, and time.  Psychiatric: She has a normal mood and affect.  Nursing note and vitals reviewed.  BP 132/90 (BP Location: Left Arm, Patient Position: Sitting, Cuff Size: Large)   Pulse 94   Temp 98.2 F (36.8 C) (Oral)   Ht 5' 4.5" (1.638 m)   Wt 192  lb (87.1 kg)   SpO2 98%   BMI 32.45 kg/m  Wt Readings from Last 3 Encounters:  04/28/18 192 lb (87.1 kg)  07/14/17 194 lb (88 kg)  10/14/16 192 lb 12.8 oz (87.5 kg)     Lab Results  Component Value Date   WBC 6.8 10/14/2016   HGB 14.0 10/14/2016   HCT 41.4 10/14/2016   PLT 204.0 10/14/2016   GLUCOSE 96 07/26/2017   CHOL 165  10/14/2016   TRIG 73.0 10/14/2016   HDL 56.10 10/14/2016   LDLCALC 94 10/14/2016   ALT 23 10/14/2016   AST 22 10/14/2016   NA 140 07/26/2017   K 4.2 07/26/2017   CL 103 07/26/2017   CREATININE 0.61 07/26/2017   BUN 18 07/26/2017   CO2 32 07/26/2017   TSH 1.08 10/14/2016   INR 1.08 07/03/2010   HGBA1C 5.4 10/14/2016    Mr Abdomen W Wo Contrast  Result Date: 07/30/2017 CLINICAL DATA:  Left adrenal mass on recent lumbar MRI. EXAM: MRI ABDOMEN WITHOUT AND WITH CONTRAST TECHNIQUE: Multiplanar multisequence MR imaging of the abdomen was performed both before and after the administration of intravenous contrast. CONTRAST:  63mL MULTIHANCE GADOBENATE DIMEGLUMINE 529 MG/ML IV SOLN COMPARISON:  None. FINDINGS: Lower chest: No acute findings. Hepatobiliary: No hepatic masses identified. Gallbladder is unremarkable. No evidence of biliary ductal dilatation. Pancreas:  No mass or inflammatory changes. Spleen:  Within normal limits in size and appearance. Adrenals/Urinary Tract: 2.2 cm homogeneous left adrenal mass shows signal dropout on opposed phase imaging, consistent with a benign adenoma. Right adrenal gland and kidneys are normal in appearance. Stomach/Bowel: Visualized portions within the abdomen are unremarkable. Vascular/Lymphatic: No pathologically enlarged lymph nodes identified. No abdominal aortic aneurysm. Other:  None. Musculoskeletal:  No suspicious bone lesions identified. IMPRESSION: 2 cm benign left adrenal adenoma. No other significant abnormality identified. Electronically Signed   By: Earle Gell M.D.   On: 07/30/2017 14:53     Assessment & Plan:  Plan  I have discontinued Karina Hawkins's pantoprazole. I am also having her start on meloxicam. Additionally, I am having her maintain her multivitamin, cyclobenzaprine, traMADol, lisinopril-hydrochlorothiazide, diltiazem, gabapentin, meloxicam, and ALPRAZolam.  Meds ordered this encounter  Medications  . DISCONTD: ALPRAZolam  (XANAX) 0.25 MG tablet    Sig: Take 1 tablet (0.25 mg total) by mouth 3 (three) times daily as needed.    Dispense:  90 tablet    Refill:  0  . ALPRAZolam (XANAX) 0.25 MG tablet    Sig: Take 1 tablet (0.25 mg total) by mouth 3 (three) times daily as needed.    Dispense:  90 tablet    Refill:  0  . meloxicam (MOBIC) 15 MG tablet    Sig: Take 1 tablet (15 mg total) by mouth daily.    Dispense:  30 tablet    Refill:  0    Problem List Items Addressed This Visit      Unprioritized   Acute right-sided low back pain with right-sided sciatica - Primary    F/u neurosurgeon      Relevant Medications   meloxicam (MOBIC) 15 MG tablet   meloxicam (MOBIC) 15 MG tablet   Generalized anxiety disorder    Stable Refill xanax      Relevant Medications   ALPRAZolam (XANAX) 0.25 MG tablet      Follow-up: Return if symptoms worsen or fail to improve.  Ann Held, DO

## 2018-04-29 DIAGNOSIS — F411 Generalized anxiety disorder: Secondary | ICD-10-CM | POA: Insufficient documentation

## 2018-04-29 DIAGNOSIS — M5441 Lumbago with sciatica, right side: Secondary | ICD-10-CM | POA: Insufficient documentation

## 2018-04-29 NOTE — Assessment & Plan Note (Signed)
F/u neurosurgeon   

## 2018-04-29 NOTE — Assessment & Plan Note (Signed)
Stable  Refill xanax 

## 2018-06-23 ENCOUNTER — Other Ambulatory Visit: Payer: Self-pay | Admitting: Family Medicine

## 2018-06-23 DIAGNOSIS — M5441 Lumbago with sciatica, right side: Secondary | ICD-10-CM

## 2018-07-29 ENCOUNTER — Other Ambulatory Visit: Payer: Self-pay | Admitting: Family Medicine

## 2018-07-29 DIAGNOSIS — I10 Essential (primary) hypertension: Secondary | ICD-10-CM

## 2018-08-10 IMAGING — MR MR ABDOMEN WO/W CM
9 of 18 series · 20 of 48 positions shown · IV contrast (multihance)
Comparison: None.

CLINICAL DATA: Left adrenal mass on recent lumbar MRI.

EXAM:
MRI ABDOMEN WITHOUT AND WITH CONTRAST
TECHNIQUE: Multiplanar multisequence MR imaging of the abdomen was performed
both before and after the administration of intravenous contrast.
CONTRAST:  17mL MULTIHANCE GADOBENATE DIMEGLUMINE 529 MG/ML IV SOLN

[Series 4: T2 · coronal · 7.0mm · 1.41mm/px · 1 of 28 slices shown (1 of 2)]
[im 1/28]
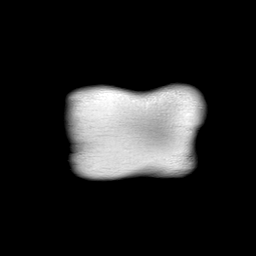

[Series 5: T2 fat-sat · axial · 6.0mm · 1.41mm/px · 1 of 35 slices shown]
[im 1/35]
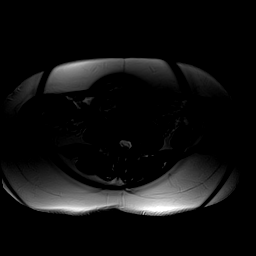

[Series 6: acr-in + out · axial · 6.0mm · 0.70mm/px · z∈[-129,+103]mm · 2 of 64 slices shown]
[im 1/64]
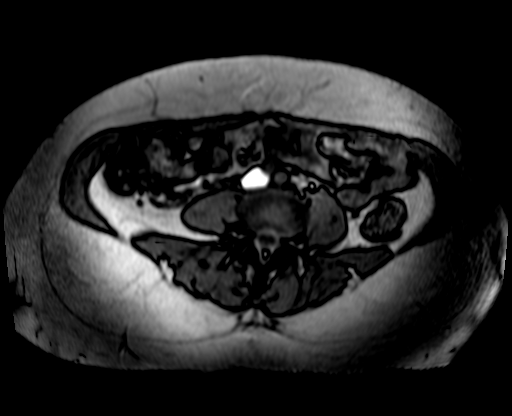
[im 64/64]
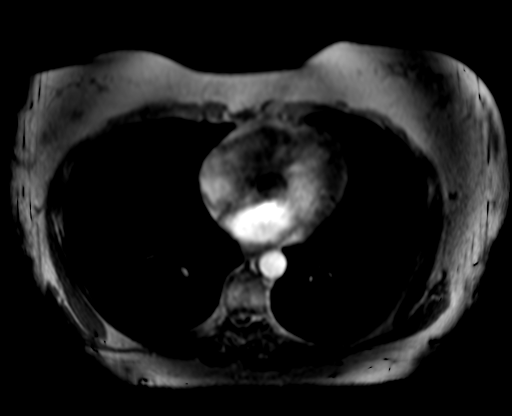

[Series 7: ep2d_diff_b50_500_800 free breathing · axial · 6.0mm · 1.98mm/px · z∈[-111,+129]mm · 4 of 99 slices shown]
[im 1/99]
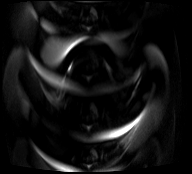
[im 33/99]
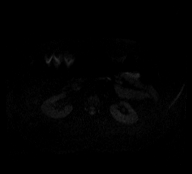
[im 66/99]
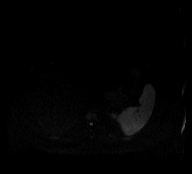
[im 99/99]
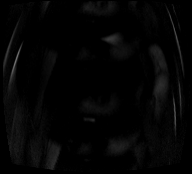

[Series 8: ep2d_diff_b50_500_800 free breathing_adc · axial · 6.0mm · 1.98mm/px · z∈[-111,+129]mm · 2 of 33 slices shown]
[im 1/33]
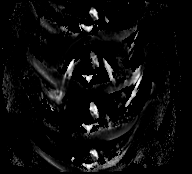
[im 33/33]
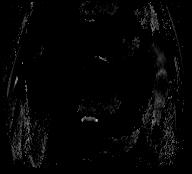

[Series 9: in + out · coronal · 7.0mm · 0.70mm/px · 3 of 52 slices shown]
[im 1/52]
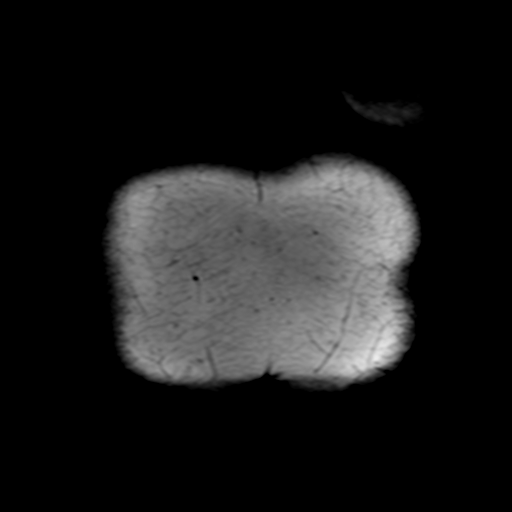
[im 26/52]
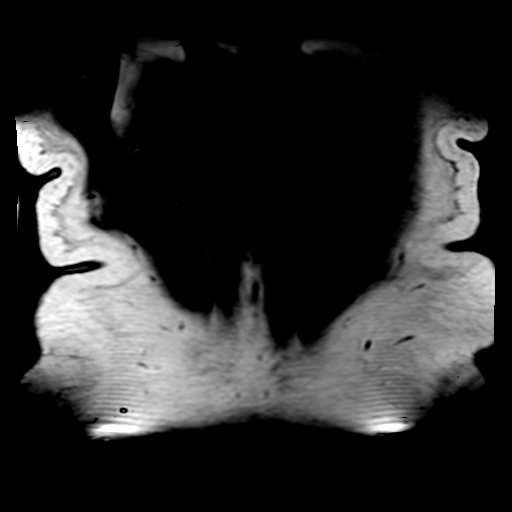
[im 52/52]
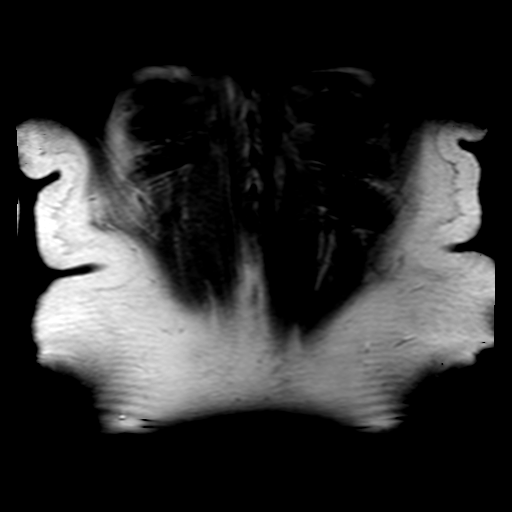

[Series 10: DWI · axial · 6.0mm · 0.70mm/px · z∈[-129,+103]mm · 2 of 32 slices shown]
[im 1/32]
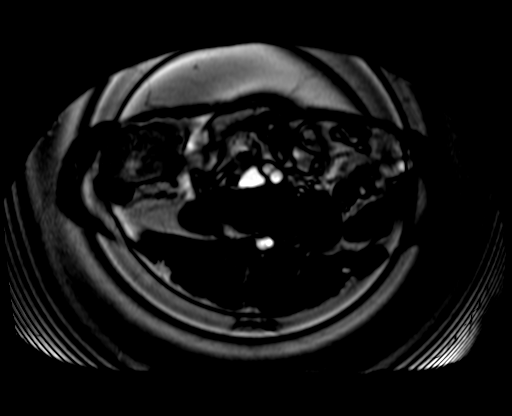
[im 32/32]
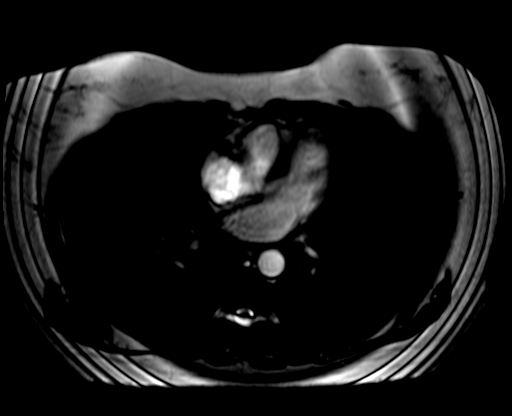

[Series 20: T2 · axial · 6.0mm · 1.48mm/px · z∈[-129,+103]mm · 2 of 32 slices shown (2 of 2)]
[im 1/32]
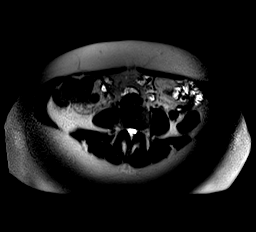
[im 32/32]
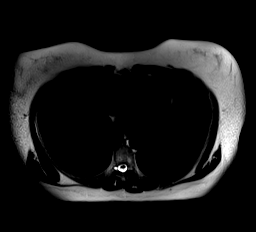

[Series 5003: sub_s18-s11_1 · axial · 4.4mm · 0.70mm/px · z∈[-150,+110]mm · 3 of 60 slices shown]
[im 1/60]
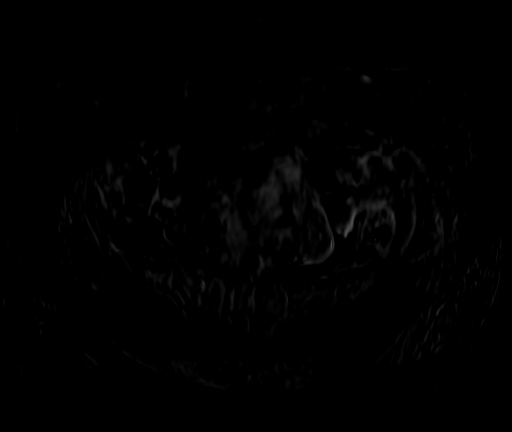
[im 30/60]
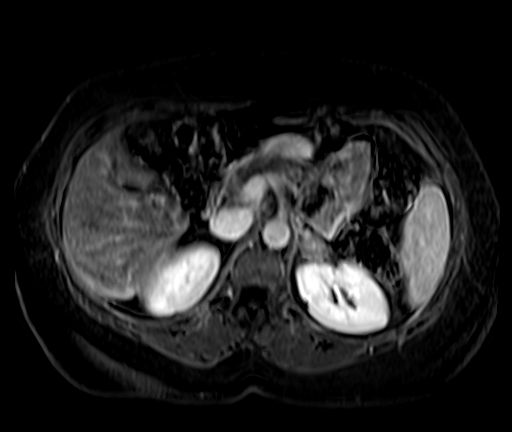
[im 60/60]
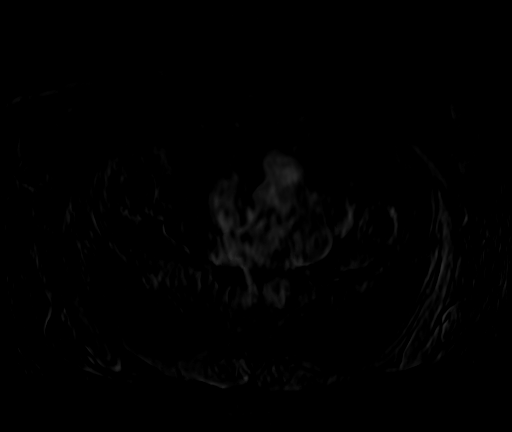

[20 of 48 positions shown; findings below may reference images not displayed]

FINDINGS: Lower chest: No acute findings.

Hepatobiliary: No hepatic masses identified. Gallbladder is
unremarkable. No evidence of biliary ductal dilatation.

Pancreas:  No mass or inflammatory changes.

Spleen:  Within normal limits in size and appearance.

Adrenals/Urinary Tract: 2.2 cm homogeneous left adrenal mass shows
signal dropout on opposed phase imaging, consistent with a benign
adenoma. Right adrenal gland and kidneys are normal in appearance.

Stomach/Bowel: Visualized portions within the abdomen are
unremarkable.

Vascular/Lymphatic: No pathologically enlarged lymph nodes
identified. No abdominal aortic aneurysm.

Other:  None.

Musculoskeletal:  No suspicious bone lesions identified.
IMPRESSION: 2 cm benign left adrenal adenoma. No other significant abnormality
identified.

## 2018-09-18 ENCOUNTER — Other Ambulatory Visit: Payer: Self-pay | Admitting: Family Medicine

## 2019-01-11 ENCOUNTER — Telehealth: Payer: Self-pay | Admitting: *Deleted

## 2019-01-11 NOTE — Telephone Encounter (Signed)
Pt last seen 04/2018 for acute visit. She is past due for follow up of HTN, anxiety and DM. Left detailed message for pt to call the office to schedule a Virtual Visit.

## 2019-05-23 ENCOUNTER — Other Ambulatory Visit: Payer: Self-pay | Admitting: *Deleted

## 2019-05-23 MED ORDER — DILTIAZEM HCL ER COATED BEADS 240 MG PO CP24: 240.0000 mg | ORAL_CAPSULE | Freq: Every day | ORAL | 0 refills | Status: DC

## 2019-06-16 ENCOUNTER — Other Ambulatory Visit: Payer: Self-pay | Admitting: Family Medicine

## 2019-06-20 ENCOUNTER — Other Ambulatory Visit: Payer: Self-pay

## 2019-06-21 ENCOUNTER — Ambulatory Visit (INDEPENDENT_AMBULATORY_CARE_PROVIDER_SITE_OTHER): Payer: Managed Care, Other (non HMO) | Admitting: Family Medicine

## 2019-06-21 ENCOUNTER — Encounter: Payer: Self-pay | Admitting: Family Medicine

## 2019-06-21 DIAGNOSIS — Z1211 Encounter for screening for malignant neoplasm of colon: Secondary | ICD-10-CM

## 2019-06-21 DIAGNOSIS — F411 Generalized anxiety disorder: Secondary | ICD-10-CM | POA: Diagnosis not present

## 2019-06-21 DIAGNOSIS — Z23 Encounter for immunization: Secondary | ICD-10-CM | POA: Diagnosis not present

## 2019-06-21 DIAGNOSIS — I10 Essential (primary) hypertension: Secondary | ICD-10-CM | POA: Diagnosis not present

## 2019-06-21 MED ORDER — ALPRAZOLAM 0.25 MG PO TABS: 0.2500 mg | ORAL_TABLET | Freq: Three times a day (TID) | ORAL | 0 refills | Status: DC | PRN

## 2019-06-21 MED ORDER — LISINOPRIL-HYDROCHLOROTHIAZIDE 20-12.5 MG PO TABS: 1.0000 | ORAL_TABLET | Freq: Every day | ORAL | 1 refills | Status: DC

## 2019-06-21 MED ORDER — DILTIAZEM HCL ER COATED BEADS 240 MG PO CP24: 240.0000 mg | ORAL_CAPSULE | Freq: Every day | ORAL | 0 refills | Status: DC

## 2019-06-21 NOTE — Progress Notes (Signed)
Patient ID: Karina Hawkins, female    DOB: [birth year]  Age: 53 y.o. MRN: [id number]    Subjective:  Subjective  HPI Karina Hawkins presents for f/u bp and anxiety.  She is struggling to lose weight   Review of Systems  Constitutional: Negative for appetite change, diaphoresis, fatigue and unexpected weight change.  Eyes: Negative for pain, redness and visual disturbance.  Respiratory: Negative for cough, chest tightness, shortness of breath and wheezing.   Cardiovascular: Negative for chest pain, palpitations and leg swelling.  Endocrine: Negative for cold intolerance, heat intolerance, polydipsia, polyphagia and polyuria.  Genitourinary: Negative for difficulty urinating, dysuria and frequency.  Neurological: Negative for dizziness, light-headedness, numbness and headaches.    History Past Medical History:  Diagnosis Date  . Anxiety   . Esophageal reflux   . HTN (hypertension)   . Hyperglycemia   . Overweight(278.02)   . Pneumonia     She has a past surgical history that includes Tonsillectomy (Age 62); Anal fissure repair (1993); Tubal ligation; and Foot surgery (2002).   Her family history includes Breast cancer in her maternal grandmother; Diabetes in her mother; Emphysema in her maternal grandfather; Hyperlipidemia in her father and mother; Hypertension in her father, maternal grandfather, maternal grandmother, mother, paternal grandfather, and paternal grandmother; Rheum arthritis in her mother; Stroke in her mother.She reports that she has never smoked. She has never used smokeless tobacco. She reports current alcohol use. She reports that she does not use drugs.  Current Outpatient Medications on File Prior to Visit  Medication Sig Dispense Refill  . Multiple Vitamin (MULTIVITAMIN) tablet Take 1 tablet by mouth daily.       No current facility-administered medications on file prior to visit.      Objective:  Objective  Physical Exam Vitals signs and nursing note  reviewed.  Constitutional:      Appearance: She is well-developed.  HENT:     Head: Normocephalic and atraumatic.  Eyes:     Conjunctiva/sclera: Conjunctivae normal.  Neck:     Musculoskeletal: Normal range of motion and neck supple.     Thyroid: No thyromegaly.     Vascular: No carotid bruit or JVD.  Cardiovascular:     Rate and Rhythm: Normal rate and regular rhythm.     Heart sounds: Normal heart sounds. No murmur.  Pulmonary:     Effort: Pulmonary effort is normal. No respiratory distress.     Breath sounds: Normal breath sounds. No wheezing or rales.  Chest:     Chest wall: No tenderness.  Neurological:     Mental Status: She is alert and oriented to person, place, and time.    BP 130/80 (BP Location: Right Arm, Patient Position: Sitting, Cuff Size: Normal)   Pulse 76   Temp (!) 97 F (36.1 C) (Temporal)   Resp 18   Ht 5' 4.5" (1.638 m)   Wt 198 lb (89.8 kg)   SpO2 98%   BMI 33.46 kg/m  Wt Readings from Last 3 Encounters:  06/21/19 198 lb (89.8 kg)  04/28/18 192 lb (87.1 kg)  07/14/17 194 lb (88 kg)     Lab Results  Component Value Date   WBC 6.8 10/14/2016   HGB 14.0 10/14/2016   HCT 41.4 10/14/2016   PLT 204.0 10/14/2016   GLUCOSE 96 07/26/2017   CHOL 165 10/14/2016   TRIG 73.0 10/14/2016   HDL 56.10 10/14/2016   LDLCALC 94 10/14/2016   ALT 23 10/14/2016   AST 22 10/14/2016  NA 140 07/26/2017   K 4.2 07/26/2017   CL 103 07/26/2017   CREATININE 0.61 07/26/2017   BUN 18 07/26/2017   CO2 32 07/26/2017   TSH 1.08 10/14/2016   INR 1.08 07/03/2010   HGBA1C 5.4 10/14/2016    Mr Abdomen W Wo Contrast  Result Date: 07/30/2017 CLINICAL DATA:  Left adrenal mass on recent lumbar MRI. EXAM: MRI ABDOMEN WITHOUT AND WITH CONTRAST TECHNIQUE: Multiplanar multisequence MR imaging of the abdomen was performed both before and after the administration of intravenous contrast. CONTRAST:  56mL MULTIHANCE GADOBENATE DIMEGLUMINE 529 MG/ML IV SOLN COMPARISON:  None.  FINDINGS: Lower chest: No acute findings. Hepatobiliary: No hepatic masses identified. Gallbladder is unremarkable. No evidence of biliary ductal dilatation. Pancreas:  No mass or inflammatory changes. Spleen:  Within normal limits in size and appearance. Adrenals/Urinary Tract: 2.2 cm homogeneous left adrenal mass shows signal dropout on opposed phase imaging, consistent with a benign adenoma. Right adrenal gland and kidneys are normal in appearance. Stomach/Bowel: Visualized portions within the abdomen are unremarkable. Vascular/Lymphatic: No pathologically enlarged lymph nodes identified. No abdominal aortic aneurysm. Other:  None. Musculoskeletal:  No suspicious bone lesions identified. IMPRESSION: 2 cm benign left adrenal adenoma. No other significant abnormality identified. Electronically Signed   By: Earle Gell M.D.   On: 07/30/2017 14:53     Assessment & Plan:  Plan  I have discontinued Nykesha E. Minerd's cyclobenzaprine, traMADol, gabapentin, meloxicam, and meloxicam. I have also changed her lisinopril-hydrochlorothiazide. Additionally, I am having her maintain her multivitamin, diltiazem, and ALPRAZolam.  Meds ordered this encounter  Medications  . lisinopril-hydrochlorothiazide (ZESTORETIC) 20-12.5 MG tablet    Sig: Take 1 tablet by mouth daily.    Dispense:  90 tablet    Refill:  1  . diltiazem (CARDIZEM CD) 240 MG 24 hr capsule    Sig: Take 1 capsule (240 mg total) by mouth daily. Needs ov before any more refills    Dispense:  30 capsule    Refill:  0  . ALPRAZolam (XANAX) 0.25 MG tablet    Sig: Take 1 tablet (0.25 mg total) by mouth 3 (three) times daily as needed.    Dispense:  90 tablet    Refill:  0    Problem List Items Addressed This Visit      Unprioritized   Essential hypertension    Well controlled, no changes to meds. Encouraged heart healthy diet such as the DASH diet and exercise as tolerated.        Relevant Medications   lisinopril-hydrochlorothiazide  (ZESTORETIC) 20-12.5 MG tablet   diltiazem (CARDIZEM CD) 240 MG 24 hr capsule   Other Relevant Orders   Lipid panel   Hemoglobin A1c   CBC with Differential/Platelet   TSH   Comprehensive metabolic panel   Microalbumin / creatinine urine ratio   Generalized anxiety disorder   Relevant Medications   ALPRAZolam (XANAX) 0.25 MG tablet    Other Visit Diagnoses    Morbid obesity ([place])    -  Primary   Relevant Orders   Amb Ref to Medical Weight Management   Lipid panel   Hemoglobin A1c   CBC with Differential/Platelet   TSH   Comprehensive metabolic panel   Microalbumin / creatinine urine ratio   VITAMIN D 25 Hydroxy (Vit-D Deficiency, Fractures)   Colon cancer screening       Relevant Orders   Ambulatory referral to Gastroenterology   Need for influenza vaccination       Relevant Orders  Flu Vaccine QUAD 6+ mos PF IM (Fluarix Quad PF) (Completed)      Follow-up: Return in about 6 months (around 12/19/2019), or if symptoms worsen or fail to improve, for annual exam, fasting.  Ann Held, DO

## 2019-06-21 NOTE — Patient Instructions (Signed)

## 2019-06-21 NOTE — Assessment & Plan Note (Signed)
Well controlled, no changes to meds. Encouraged heart healthy diet such as the DASH diet and exercise as tolerated.  °

## 2019-06-22 LAB — CBC WITH DIFFERENTIAL/PLATELET
Basophils Absolute: 0.1 10*3/uL (ref 0.0–0.1)
Basophils Relative: 1 % (ref 0.0–3.0)
Eosinophils Absolute: 0.1 10*3/uL (ref 0.0–0.7)
Eosinophils Relative: 1 % (ref 0.0–5.0)
HCT: 42.7 % (ref 36.0–46.0)
Hemoglobin: 14.2 g/dL (ref 12.0–15.0)
Lymphocytes Relative: 29.8 % (ref 12.0–46.0)
Lymphs Abs: 1.9 10*3/uL (ref 0.7–4.0)
MCHC: 33.4 g/dL (ref 30.0–36.0)
MCV: 85.9 fl (ref 78.0–100.0)
Monocytes Absolute: 0.4 10*3/uL (ref 0.1–1.0)
Monocytes Relative: 5.8 % (ref 3.0–12.0)
Neutro Abs: 4 10*3/uL (ref 1.4–7.7)
Neutrophils Relative %: 62.4 % (ref 43.0–77.0)
Platelets: 181 10*3/uL (ref 150.0–400.0)
RBC: 4.97 Mil/uL (ref 3.87–5.11)
RDW: 14.3 % (ref 11.5–15.5)
WBC: 6.3 10*3/uL (ref 4.0–10.5)

## 2019-06-22 LAB — COMPREHENSIVE METABOLIC PANEL
ALT: 19 U/L (ref 0–35)
AST: 17 U/L (ref 0–37)
Albumin: 4.6 g/dL (ref 3.5–5.2)
Alkaline Phosphatase: 119 U/L — ABNORMAL HIGH (ref 39–117)
BUN: 13 mg/dL (ref 6–23)
CO2: 33 mEq/L — ABNORMAL HIGH (ref 19–32)
Calcium: 10.3 mg/dL (ref 8.4–10.5)
Chloride: 101 mEq/L (ref 96–112)
Creatinine, Ser: 0.6 mg/dL (ref 0.40–1.20)
GFR: 104.85 mL/min (ref >60.00)
Glucose, Bld: 91 mg/dL (ref 70–99)
Potassium: 4.3 mEq/L (ref 3.5–5.1)
Sodium: 141 mEq/L (ref 135–145)
Total Bilirubin: 0.5 mg/dL (ref 0.2–1.2)
Total Protein: 7.1 g/dL (ref 6.0–8.3)

## 2019-06-22 LAB — LIPID PANEL
Cholesterol: 169 mg/dL (ref 0–200)
HDL: 55.1 mg/dL (ref >39.00)
LDL Cholesterol: 102 mg/dL — ABNORMAL HIGH (ref 0–99)
NonHDL: 114.3
Total CHOL/HDL Ratio: 3
Triglycerides: 62 mg/dL (ref 0.0–149.0)
VLDL: 12.4 mg/dL (ref 0.0–40.0)

## 2019-06-22 LAB — HEMOGLOBIN A1C: Hgb A1c MFr Bld: 5.5 % (ref 4.6–6.5)

## 2019-06-22 LAB — MICROALBUMIN / CREATININE URINE RATIO
Creatinine,U: 28.1 mg/dL
Microalb Creat Ratio: 2.5 mg/g (ref 0.0–30.0)
Microalb, Ur: <0.7 mg/dL (ref 0.0–1.9)

## 2019-06-22 LAB — TSH: TSH: 2.19 u[IU]/mL (ref 0.35–4.50)

## 2019-07-21 ENCOUNTER — Other Ambulatory Visit: Payer: Self-pay | Admitting: Family Medicine

## 2019-07-21 DIAGNOSIS — I10 Essential (primary) hypertension: Secondary | ICD-10-CM

## 2019-10-30 ENCOUNTER — Ambulatory Visit (INDEPENDENT_AMBULATORY_CARE_PROVIDER_SITE_OTHER): Payer: 59 | Admitting: Family Medicine

## 2019-10-30 ENCOUNTER — Encounter (INDEPENDENT_AMBULATORY_CARE_PROVIDER_SITE_OTHER): Payer: Self-pay | Admitting: Family Medicine

## 2019-10-30 ENCOUNTER — Other Ambulatory Visit: Payer: Self-pay

## 2019-10-30 VITALS — BP 139/98 | HR 77 | Temp 98.1°F | Ht 64.0 in | Wt 196.0 lb

## 2019-10-30 DIAGNOSIS — E669 Obesity, unspecified: Secondary | ICD-10-CM

## 2019-10-30 DIAGNOSIS — I10 Essential (primary) hypertension: Secondary | ICD-10-CM | POA: Diagnosis not present

## 2019-10-30 DIAGNOSIS — R1013 Epigastric pain: Secondary | ICD-10-CM

## 2019-10-30 DIAGNOSIS — G4709 Other insomnia: Secondary | ICD-10-CM

## 2019-10-30 DIAGNOSIS — F988 Other specified behavioral and emotional disorders with onset usually occurring in childhood and adolescence: Secondary | ICD-10-CM

## 2019-10-30 DIAGNOSIS — Z9189 Other specified personal risk factors, not elsewhere classified: Secondary | ICD-10-CM | POA: Diagnosis not present

## 2019-10-30 DIAGNOSIS — M545 Low back pain, unspecified: Secondary | ICD-10-CM

## 2019-10-30 DIAGNOSIS — R0602 Shortness of breath: Secondary | ICD-10-CM

## 2019-10-30 DIAGNOSIS — R5383 Other fatigue: Secondary | ICD-10-CM

## 2019-10-30 DIAGNOSIS — Z6833 Body mass index (BMI) 33.0-33.9, adult: Secondary | ICD-10-CM

## 2019-10-30 DIAGNOSIS — Z1331 Encounter for screening for depression: Secondary | ICD-10-CM | POA: Diagnosis not present

## 2019-10-30 DIAGNOSIS — F419 Anxiety disorder, unspecified: Secondary | ICD-10-CM

## 2019-10-30 DIAGNOSIS — Z0289 Encounter for other administrative examinations: Secondary | ICD-10-CM

## 2019-10-31 ENCOUNTER — Encounter (INDEPENDENT_AMBULATORY_CARE_PROVIDER_SITE_OTHER): Payer: Self-pay | Admitting: Family Medicine

## 2019-10-31 LAB — CBC WITH DIFFERENTIAL/PLATELET
Basophils Absolute: 0 10*3/uL (ref 0.0–0.2)
Basos: 0 %
EOS (ABSOLUTE): 0 10*3/uL (ref 0.0–0.4)
Eos: 1 %
Hemoglobin: 14.6 g/dL (ref 11.1–15.9)
Immature Grans (Abs): 0.1 10*3/uL (ref 0.0–0.1)
Immature Granulocytes: 1 %
Lymphocytes Absolute: 1.3 10*3/uL (ref 0.7–3.1)
Lymphs: 22 %
MCH: 29.6 pg (ref 26.6–33.0)
MCHC: 33.6 g/dL (ref 31.5–35.7)
MCV: 88 fL (ref 79–97)
Monocytes Absolute: 0.3 10*3/uL (ref 0.1–0.9)
Monocytes: 5 %
Neutrophils Absolute: 4.5 10*3/uL (ref 1.4–7.0)
Neutrophils: 71 %
Platelets: 195 10*3/uL (ref 150–450)
RBC: 4.93 x10E6/uL (ref 3.77–5.28)
RDW: 12.9 % (ref 11.7–15.4)
WBC: 6.2 10*3/uL (ref 3.4–10.8)

## 2019-10-31 LAB — LIPID PANEL WITH LDL/HDL RATIO
Cholesterol, Total: 164 mg/dL (ref 100–199)
HDL: 63 mg/dL (ref >39)
LDL Chol Calc (NIH): 86 mg/dL (ref 0–99)
LDL/HDL Ratio: 1.4 ratio (ref 0.0–3.2)
Triglycerides: 80 mg/dL (ref 0–149)
VLDL Cholesterol Cal: 15 mg/dL (ref 5–40)

## 2019-10-31 LAB — ANEMIA PANEL
Ferritin: 82 ng/mL (ref 15–150)
Folate, Hemolysate: 486 ng/mL
Folate, RBC: 1117 ng/mL (ref >498)
Hematocrit: 43.5 % (ref 34.0–46.6)
Iron Saturation: 19 % (ref 15–55)
Iron: 67 ug/dL (ref 27–159)
Retic Ct Pct: 1.6 % (ref 0.6–2.6)
Total Iron Binding Capacity: 347 ug/dL (ref 250–450)
UIBC: 280 ug/dL (ref 131–425)
Vitamin B-12: 629 pg/mL (ref 232–1245)

## 2019-10-31 LAB — COMPREHENSIVE METABOLIC PANEL
ALT: 21 IU/L (ref 0–32)
AST: 18 IU/L (ref 0–40)
Albumin/Globulin Ratio: 2 (ref 1.2–2.2)
Albumin: 4.8 g/dL (ref 3.8–4.9)
Alkaline Phosphatase: 130 IU/L — ABNORMAL HIGH (ref 39–117)
BUN/Creatinine Ratio: 18 (ref 9–23)
BUN: 11 mg/dL (ref 6–24)
Bilirubin Total: 0.4 mg/dL (ref 0.0–1.2)
CO2: 24 mmol/L (ref 20–29)
Calcium: 9.6 mg/dL (ref 8.7–10.2)
Chloride: 102 mmol/L (ref 96–106)
Creatinine, Ser: 0.61 mg/dL (ref 0.57–1.00)
GFR calc Af Amer: 121 mL/min/{1.73_m2} (ref >59)
GFR calc non Af Amer: 105 mL/min/{1.73_m2} (ref >59)
Globulin, Total: 2.4 g/dL (ref 1.5–4.5)
Glucose: 90 mg/dL (ref 65–99)
Potassium: 4.2 mmol/L (ref 3.5–5.2)
Sodium: 145 mmol/L — ABNORMAL HIGH (ref 134–144)
Total Protein: 7.2 g/dL (ref 6.0–8.5)

## 2019-10-31 LAB — VITAMIN D 25 HYDROXY (VIT D DEFICIENCY, FRACTURES): Vit D, 25-Hydroxy: 32.3 ng/mL (ref 30.0–100.0)

## 2019-10-31 LAB — HEMOGLOBIN A1C
Est. average glucose Bld gHb Est-mCnc: 108 mg/dL
Hgb A1c MFr Bld: 5.4 % (ref 4.8–5.6)

## 2019-10-31 LAB — INSULIN, RANDOM: INSULIN: 13.1 u[IU]/mL (ref 2.6–24.9)

## 2019-10-31 LAB — T3: T3, Total: 137 ng/dL (ref 71–180)

## 2019-10-31 LAB — TSH: TSH: 1.93 u[IU]/mL (ref 0.450–4.500)

## 2019-10-31 LAB — T4, FREE: Free T4: 1.25 ng/dL (ref 0.82–1.77)

## 2019-10-31 MED ORDER — ESOMEPRAZOLE MAGNESIUM 40 MG PO CPDR: 40.0000 mg | DELAYED_RELEASE_CAPSULE | Freq: Every day | ORAL | 0 refills | Status: DC

## 2019-10-31 NOTE — Progress Notes (Signed)
Dear Dr. Etter Sjogren,   Thank you for referring Karina Hawkins to our clinic. The following note includes my evaluation and treatment recommendations.  Chief Complaint:   Karina Hawkins (MR# [id number]) is a 54 y.o. female who presents for evaluation and treatment of obesity and related comorbidities. Current BMI is Body mass index is 33.64 kg/m. Karina Hawkins has been struggling with her weight for many years and has been unsuccessful in either losing weight, maintaining weight loss, or reaching her healthy weight goal.  Karina Hawkins is currently in the action stage of change and ready to dedicate time achieving and maintaining a healthier weight. Karina Hawkins is interested in becoming our patient and working on intensive lifestyle modifications including (but not limited to) diet and exercise for weight loss.  Karina Hawkins's habits were reviewed today and are as follows: Her family eats meals together, she thinks her family will eat healthier with her, her desired weight loss is 40 pounds, she has been heavy most of her life, she started gaining weight after the birth of her last child in 2010, her heaviest weight ever was 200 pounds, she craves sweet/salty foods, kettle corn, pasta, chocolate, and ice cream, she sometimes snacks in the evenings, she skips lunch frequently, she is frequently drinking liquids with calories, she frequently makes poor food choices, she frequently eats larger portions than normal and she struggles with emotional eating.  Depression Screen Karina Hawkins's Food and Mood (modified PHQ-9) score was 14.  Depression screen PHQ 2/9 10/30/2019  Decreased Interest 1  Down, Depressed, Hopeless 1  PHQ - 2 Score 2  Altered sleeping 2  Tired, decreased energy 3  Change in appetite 2  Feeling bad or failure about yourself  2  Trouble concentrating 3  Moving slowly or fidgety/restless 0  Suicidal thoughts 0  PHQ-9 Score 14  Difficult doing work/chores Not difficult at all   Subjective:   1. Other  fatigue Karina Hawkins admits to daytime somnolence and reports waking up still tired. Patent has a history of symptoms of daytime fatigue, morning fatigue and snoring. Karina Hawkins generally gets 5 or 6 hours of sleep per night, and states that she has poor quality sleep. Snoring is present. Apneic episodes are not present. Epworth Sleepiness Score is 11.  2. Shortness of breath on exertion Karina Hawkins notes increasing shortness of breath with exercising and seems to be worsening over time with weight gain. She notes getting out of breath sooner with activity than she used to. This has gotten worse recently. Karina Hawkins denies shortness of breath at rest or orthopnea.  3. Essential hypertension Review: taking medications as instructed, no medication side effects noted.  She is taking lisinopril-HCTZ and diltiazem for blood pressure control.  BP Readings from Last 3 Encounters:  10/30/19 (!) 139/98  06/21/19 130/80  04/28/18 132/90   4. Lumbar back pain Karina Hawkins has history of L5-S1 repair in 2019.   5. Other insomnia Karina Hawkins states she has difficulty staying asleep.  6. Dyspepsia Karina Hawkins struggles with dyspepsia on a daily basis.  7. Attention deficit disorder, unspecified hyperactivity presence She has a history of taking Adderall for ADD.  8. Anxiety Karina Hawkins has increased stress.  She takes Xanax prn.  She also displays emotional eating behaviors when stressed, sad, bored, for comfort, and to stay awake.  Her PHQ-9 was 14 today.  9. Depression screening Karina Hawkins was screened for depression today as part of her new patient workup.  10. At risk for hypoglycemia Karina Hawkins is at increased risk for  hypoglycemia due to changes in diet, diagnosis of diabetes, and/or insulin use. [place] is not currently taking insulin.   Assessment/Plan:   1. Other fatigue Karina Hawkins does feel that her weight is causing her energy to be lower than it should be. Fatigue may be related to obesity, depression or many other causes. Labs will be ordered, and in the  meanwhile, Karina Hawkins will focus on self care including making healthy food choices, increasing physical activity and focusing on stress reduction.  Orders - EKG 12-Lead - Vitamin B12 - CBC with Differential/Platelet - Comprehensive metabolic panel - Hemoglobin A1c - Insulin, random - T3 - T4, free - TSH - VITAMIN D 25 Hydroxy (Vit-D Deficiency, Fractures)  2. Shortness of breath on exertion Karina Hawkins does feel that she gets out of breath more easily that she used to when she exercises. Karina Hawkins's shortness of breath appears to be obesity related and exercise induced. She has agreed to work on weight loss and gradually increase exercise to treat her exercise induced shortness of breath. Will continue to monitor closely.  Indirect calorimetry today.  Orders - Lipid Panel With LDL/HDL Ratio  3. Essential hypertension Karina Hawkins is working on healthy weight loss and exercise to improve blood pressure control. We will watch for signs of hypotension as she continues her lifestyle modifications.  4. Lumbar back pain Will monitor.  5. Other insomnia Question possible serotonin deficiency.  Patient will consider Prozac going forward.  6. Dyspepsia Will refer to GI and start on Nexium, as below.  Patient snores when sleeping.  Epworth Sleepiness Score was 11 today.  Orders - Ambulatory referral to Gastroenterology - esomeprazole (NEXIUM) 40 MG capsule; Take 1 capsule (40 mg total) by mouth daily at 12 noon.  Dispense: 30 capsule; Refill: 0  7. Attention deficit disorder, unspecified hyperactivity presence Consider Wellbutrin vs. ADHD clinic going forward.  8. Anxiety Behavior modification techniques were discussed today to help [place] deal with her anxiety.  Orders and follow up as documented in patient record.   9. Depression screening Jalesa had a positive depression screening. Depression is commonly associated with obesity and often results in emotional eating behaviors. We will monitor this closely and  work on CBT to help improve the non-hunger eating patterns. Referral to Psychology may be required if no improvement is seen as she continues in our clinic.  10. At risk for hypoglycemia Karina Hawkins was given approximately 15 minutes of counseling today regarding prevention of hypoglycemia. She was advised of symptoms of hypoglycemia. Karina Hawkins was instructed to eat regular meals.   11. Class 1 obesity with serious comorbidity and body mass index (BMI) of 33.0 to 33.9 in adult, unspecified obesity type Karina Hawkins is currently in the action stage of change and her goal is to continue with weight loss efforts. I recommend Karina Hawkins begin the structured treatment plan as follows:  She has agreed to the Category 1 Plan.    She may have 1 cup decaf coffee a day.  Exercise goals: No exercise has been prescribed at this time.   Behavioral modification strategies: increasing lean protein intake, decreasing simple carbohydrates, increasing vegetables, increasing water intake and decreasing liquid calories.  She was informed of the importance of frequent follow-up visits to maximize her success with intensive lifestyle modifications for her multiple health conditions. She was informed we would discuss her lab results at her next visit unless there is a critical issue that needs to be addressed sooner. Karina Hawkins agreed to keep her next visit at the agreed upon time  to discuss these results.  Objective:   Blood pressure (!) 139/98, pulse 77, temperature 98.1 F (36.7 C), temperature source Oral, height 5\' 4"  (1.626 m), weight 196 lb (88.9 kg), SpO2 99 %. Body mass index is 33.64 kg/m.  EKG: Normal sinus rhythm, rate 73.  Indirect Calorimeter completed today shows a VO2 of 156 and a REE of 1085.  Her calculated basal metabolic rate is [id number] thus her basal metabolic rate is worse than expected.  General: Cooperative, alert, well developed, in no acute distress. HEENT: Conjunctivae and lids unremarkable. Cardiovascular: Regular  rhythm.  Lungs: Normal work of breathing. Neurologic: No focal deficits.   Lab Results  Component Value Date   CREATININE 0.61 10/30/2019   BUN 11 10/30/2019   NA 145 (H) 10/30/2019   K 4.2 10/30/2019   CL 102 10/30/2019   CO2 24 10/30/2019   Lab Results  Component Value Date   ALT 21 10/30/2019   AST 18 10/30/2019   ALKPHOS 130 (H) 10/30/2019   BILITOT 0.4 10/30/2019   Lab Results  Component Value Date   HGBA1C 5.4 10/30/2019   HGBA1C 5.5 06/21/2019   HGBA1C 5.4 10/14/2016   HGBA1C 5.3 10/23/2015   Lab Results  Component Value Date   INSULIN WILL FOLLOW 10/30/2019   Lab Results  Component Value Date   TSH 1.930 10/30/2019   Lab Results  Component Value Date   CHOL 164 10/30/2019   HDL 63 10/30/2019   LDLCALC 86 10/30/2019   TRIG 80 10/30/2019   CHOLHDL 3 06/21/2019   Lab Results  Component Value Date   WBC 6.2 10/30/2019   HGB 14.6 10/30/2019   HCT 43.5 10/30/2019   MCV 88 10/30/2019   PLT 195 10/30/2019   Lab Results  Component Value Date   IRON 67 10/30/2019   TIBC 347 10/30/2019   FERRITIN WILL FOLLOW 10/30/2019   Attestation Statements:   Reviewed by clinician on day of visit: allergies, medications, problem list, medical history, surgical history, family history, social history, and previous encounter notes.  This is the patient's first visit at [facility]. The patient's NEW PATIENT PACKET was reviewed at length. Included in the packet: current and past health history, medications, allergies, ROS, gynecologic history (women only), surgical history, family history, social history, weight history, weight loss surgery history (for those that have had weight loss surgery), nutritional evaluation, mood and food questionnaire, PHQ9, Epworth questionnaire, sleep habits questionnaire, patient life and health improvement goals questionnaire. These will all be scanned into the patient's chart under media.   During the visit, I independently  reviewed the patient's EKG, bioimpedance scale results, and indirect calorimeter results. I used this information to tailor a meal plan for the patient that will help her to lose weight and will improve her obesity-related conditions going forward. I performed a medically necessary appropriate examination and/or evaluation. I discussed the assessment and treatment plan with the patient. The patient was provided an opportunity to ask questions and all were answered. The patient agreed with the plan and demonstrated an understanding of the instructions. Labs were ordered at this visit and will be reviewed at the next visit unless more critical results need to be addressed immediately. Clinical information was updated and documented in the EMR.   Time spent on visit including pre-visit chart review and post-visit care was 65 minutes.   A separate 15 minutes was spent on risk counseling (see above).   I, Water quality scientist, CMA, am acting as Location manager  for Briscoe Deutscher, DO.  I have reviewed the above documentation for accuracy and completeness, and I agree with the above. Briscoe Deutscher, DO

## 2019-11-07 ENCOUNTER — Other Ambulatory Visit: Payer: Self-pay | Admitting: Family Medicine

## 2019-11-07 DIAGNOSIS — I10 Essential (primary) hypertension: Secondary | ICD-10-CM

## 2019-11-13 ENCOUNTER — Ambulatory Visit (INDEPENDENT_AMBULATORY_CARE_PROVIDER_SITE_OTHER): Payer: 59 | Admitting: Family Medicine

## 2019-11-13 ENCOUNTER — Other Ambulatory Visit: Payer: Self-pay

## 2019-11-13 ENCOUNTER — Encounter (INDEPENDENT_AMBULATORY_CARE_PROVIDER_SITE_OTHER): Payer: Self-pay | Admitting: Family Medicine

## 2019-11-13 VITALS — BP 118/78 | HR 70 | Temp 98.4°F | Ht 64.0 in | Wt 189.0 lb

## 2019-11-13 DIAGNOSIS — F988 Other specified behavioral and emotional disorders with onset usually occurring in childhood and adolescence: Secondary | ICD-10-CM | POA: Diagnosis not present

## 2019-11-13 DIAGNOSIS — G4700 Insomnia, unspecified: Secondary | ICD-10-CM | POA: Diagnosis not present

## 2019-11-13 DIAGNOSIS — E66811 Obesity, class 1: Secondary | ICD-10-CM

## 2019-11-13 DIAGNOSIS — E559 Vitamin D deficiency, unspecified: Secondary | ICD-10-CM | POA: Diagnosis not present

## 2019-11-13 DIAGNOSIS — R1013 Epigastric pain: Secondary | ICD-10-CM | POA: Diagnosis not present

## 2019-11-13 DIAGNOSIS — Z6832 Body mass index (BMI) 32.0-32.9, adult: Secondary | ICD-10-CM

## 2019-11-13 DIAGNOSIS — E669 Obesity, unspecified: Secondary | ICD-10-CM

## 2019-11-14 MED ORDER — ESOMEPRAZOLE MAGNESIUM 40 MG PO CPDR: 40.0000 mg | DELAYED_RELEASE_CAPSULE | Freq: Every day | ORAL | 2 refills | Status: DC

## 2019-11-14 MED ORDER — VITAMIN D (ERGOCALCIFEROL) 1.25 MG (50000 UNIT) PO CAPS: 50000.0000 [IU] | ORAL_CAPSULE | ORAL | 0 refills | Status: DC

## 2019-11-14 NOTE — Progress Notes (Signed)
Chief Complaint:   OBESITY Karina Hawkins is here to discuss her progress with her obesity treatment plan along with follow-up of her obesity related diagnoses. Karina Hawkins is on the Category 1 Plan and states she is following her eating plan approximately 100% of the time. Karina Hawkins states she is exercising for 0 minutes 0 times per week.  Today's visit was #: 2 Starting weight: 196 lbs Starting date: 10/30/2019 Today's weight: 189 lbs Today's date: 11/13/2019 Total lbs lost to date: 7 lbs Total lbs lost since last in-office visit: 7 lbs  Interim History: Karina Hawkins says she has averaged 1000 calories most days.  Her highest calorie intake was 1226.  Subjective:   1. Vitamin D deficiency Karina Hawkins's Vitamin D level was 32.3 on 10/30/2019. She is not currently taking vit D. She denies nausea, vomiting or muscle weakness.  2. Dyspepsia Karina Hawkins is taking Nexium 40 mg daily for her dyspepsia.  3. Attention deficit disorder, unspecified hyperactivity presence Karina Hawkins is not currently taking any medications for ADD.  4. Insomnia, unspecified type Karina Hawkins has difficulty staying asleep.  Assessment/Plan:   1. Vitamin D deficiency Low Vitamin D level contributes to fatigue and are associated with obesity, breast, and colon cancer. She agrees to continue to take prescription Vitamin D @50 ,000 IU every week and will follow-up for routine testing of Vitamin D, at least 2-3 times per year to avoid over-replacement.  Orders - Vitamin D, Ergocalciferol, (DRISDOL) 1.25 MG (50000 UNIT) CAPS capsule; Take 1 capsule (50,000 Units total) by mouth every 7 (seven) days.  Dispense: 4 capsule; Refill: 0  2. Dyspepsia Karina Hawkins should continue taking Nexium, as below.  Orders - esomeprazole (NEXIUM) 40 MG capsule; Take 1 capsule (40 mg total) by mouth daily at 12 noon.  Dispense: 30 capsule; Refill: 2  3. Attention deficit disorder Recommend Karina Hawkins see Karina Hawkins at [facility].  4. Insomnia, unspecified type The  problem of recurrent insomnia was discussed. Orders and follow up as documented in patient record. Counseling: Intensive lifestyle modifications are the first line treatment for this issue. We discussed several lifestyle modifications today and she will continue to work on diet, exercise and weight loss efforts.  Recommend melatonin 1-3 mg 4 hours prior to bed.  Counseling  Limit or avoid alcohol, caffeinated beverages, and cigarettes, especially close to bedtime.   Do not eat a large meal or eat spicy foods right before bedtime. This can lead to digestive discomfort that can make it hard for you to sleep.  Keep a sleep diary to help you and your health care provider figure out what could be causing your insomnia.  . Make your bedroom a dark, comfortable place where it is easy to fall asleep. ? Put up shades or blackout curtains to block light from outside. ? Use a white noise machine to block noise. ? Keep the temperature cool. . Limit screen use before bedtime. This includes: ? Watching TV. ? Using your smartphone, tablet, or computer. . Stick to a routine that includes going to bed and waking up at the same times every day and night. This can help you fall asleep faster. Consider making a quiet activity, such as reading, part of your nighttime routine. . Try to avoid taking naps during the day so that you sleep better at night. . Get out of bed if you are still awake after 15 minutes of trying to sleep. Keep the lights down, but try reading or doing a quiet activity. When you feel sleepy,  go back to bed.  5. Class 1 obesity with serious comorbidity and body mass index (BMI) of 32.0 to 32.9 in adult, unspecified obesity type Karina Hawkins is currently in the action stage of change. As such, her goal is to continue with weight loss efforts. She has agreed to keeping a food journal and adhering to recommended goals of 1000 calories and 75+ grams of protein.   Exercise goals: For substantial health  benefits, adults should do at least 150 minutes (2 hours and 30 minutes) a week of moderate-intensity, or 75 minutes (1 hour and 15 minutes) a week of vigorous-intensity aerobic physical activity, or an equivalent combination of moderate- and vigorous-intensity aerobic activity. Aerobic activity should be performed in episodes of at least 10 minutes, and preferably, it should be spread throughout the week. Adults should also include muscle-strengthening activities that involve all major muscle groups on 2 or more days a week.  Behavioral modification strategies: increasing lean protein intake.  Karina Hawkins has agreed to follow-up with our clinic in 2 weeks. She was informed of the importance of frequent follow-up visits to maximize her success with intensive lifestyle modifications for her multiple health conditions.   Objective:   Blood pressure 118/78, pulse 70, temperature 98.4 F (36.9 C), temperature source Oral, height 5\' 4"  (1.626 m), weight 189 lb (85.7 kg), SpO2 98 %. Body mass index is 32.44 kg/m.  General: Cooperative, alert, well developed, in no acute distress. HEENT: Conjunctivae and lids unremarkable. Cardiovascular: Regular rhythm.  Lungs: Normal work of breathing. Neurologic: No focal deficits.   Lab Results  Component Value Date   CREATININE 0.61 10/30/2019   BUN 11 10/30/2019   NA 145 (H) 10/30/2019   K 4.2 10/30/2019   CL 102 10/30/2019   CO2 24 10/30/2019   Lab Results  Component Value Date   ALT 21 10/30/2019   AST 18 10/30/2019   ALKPHOS 130 (H) 10/30/2019   BILITOT 0.4 10/30/2019   Lab Results  Component Value Date   HGBA1C 5.4 10/30/2019   HGBA1C 5.5 06/21/2019   HGBA1C 5.4 10/14/2016   HGBA1C 5.3 10/23/2015   Lab Results  Component Value Date   INSULIN 13.1 10/30/2019   Lab Results  Component Value Date   TSH 1.930 10/30/2019   Lab Results  Component Value Date   CHOL 164 10/30/2019   HDL 63 10/30/2019   LDLCALC 86 10/30/2019   TRIG 80  10/30/2019   CHOLHDL 3 06/21/2019   Lab Results  Component Value Date   WBC 6.2 10/30/2019   HGB 14.6 10/30/2019   HCT 43.5 10/30/2019   MCV 88 10/30/2019   PLT 195 10/30/2019   Lab Results  Component Value Date   IRON 67 10/30/2019   TIBC 347 10/30/2019   FERRITIN 82 10/30/2019   Attestation Statements:   Reviewed by clinician on day of visit: allergies, medications, problem list, medical history, surgical history, family history, social history, and previous encounter notes.  I, Water quality scientist, CMA, am acting as Location manager for [employer], DO.  I have reviewed the above documentation for accuracy and completeness, and I agree with the above. Briscoe Deutscher, DO

## 2019-11-28 ENCOUNTER — Encounter (INDEPENDENT_AMBULATORY_CARE_PROVIDER_SITE_OTHER): Payer: Self-pay | Admitting: Family Medicine

## 2019-11-28 ENCOUNTER — Other Ambulatory Visit: Payer: Self-pay

## 2019-11-28 ENCOUNTER — Ambulatory Visit (INDEPENDENT_AMBULATORY_CARE_PROVIDER_SITE_OTHER): Payer: 59 | Admitting: Family Medicine

## 2019-11-28 VITALS — BP 116/75 | HR 72 | Temp 97.7°F | Ht 64.0 in | Wt 186.0 lb

## 2019-11-28 DIAGNOSIS — Z9189 Other specified personal risk factors, not elsewhere classified: Secondary | ICD-10-CM

## 2019-11-28 DIAGNOSIS — E559 Vitamin D deficiency, unspecified: Secondary | ICD-10-CM | POA: Diagnosis not present

## 2019-11-28 DIAGNOSIS — F988 Other specified behavioral and emotional disorders with onset usually occurring in childhood and adolescence: Secondary | ICD-10-CM | POA: Diagnosis not present

## 2019-11-28 DIAGNOSIS — Z6832 Body mass index (BMI) 32.0-32.9, adult: Secondary | ICD-10-CM

## 2019-11-28 DIAGNOSIS — R1013 Epigastric pain: Secondary | ICD-10-CM | POA: Diagnosis not present

## 2019-11-28 DIAGNOSIS — G4700 Insomnia, unspecified: Secondary | ICD-10-CM | POA: Diagnosis not present

## 2019-11-28 DIAGNOSIS — E669 Obesity, unspecified: Secondary | ICD-10-CM

## 2019-11-28 MED ORDER — VITAMIN D (ERGOCALCIFEROL) 1.25 MG (50000 UNIT) PO CAPS: 50000.0000 [IU] | ORAL_CAPSULE | ORAL | 0 refills | Status: DC

## 2019-11-28 NOTE — Progress Notes (Signed)
Chief Complaint:   OBESITY Karina Hawkins is here to discuss her progress with her obesity treatment plan along with follow-up of her obesity related diagnoses. Karina Hawkins is on keeping a food journal and adhering to recommended goals of 1000 calories and 75+ grams of protein and states she is following her eating plan approximately 80% of the time. Karina Hawkins states she is walking, stairs for 15-20 minutes 3 times per week.  Today's visit was #: 3 Starting weight: 196 lbs Starting date: 10/30/2019 Today's weight: 186 lbs Today's date: 11/28/2019 Total lbs lost to date: 10 lbs Total lbs lost since last in-office visit: 3 lbs  Interim History: Karina Hawkins is doing well with journaling.  She is making good choices, even when she is out with friends.  She wants to increase her exercise.  She says she is still hungry at times.  Subjective:   1. Vitamin D deficiency Karina Hawkins's Vitamin D level was 32.3 on 10/30/2019. She is currently taking vit D. She denies nausea, vomiting or muscle weakness.  2. Dyspepsia Karina Hawkins takes Nexium for dyspepsia.  3. Insomnia, unspecified type Karina Hawkins endorses difficulty staying asleep.  4. Attention deficit disorder, unspecified hyperactivity presence Karina Hawkins says she has an upcoming appointment with a specialist.  5. At risk for osteoporosis Karina Hawkins is at higher risk of osteopenia and osteoporosis due to Vitamin D deficiency.   Assessment/Plan:   1. Vitamin D deficiency Low Vitamin D level contributes to fatigue and are associated with obesity, breast, and colon cancer. She agrees to continue to take prescription Vitamin D @50 ,000 IU every week and will follow-up for routine testing of Vitamin D, at least 2-3 times per year to avoid over-replacement.  Orders - Vitamin D, Ergocalciferol, (DRISDOL) 1.25 MG (50000 UNIT) CAPS capsule; Take 1 capsule (50,000 Units total) by mouth every 7 (seven) days.  Dispense: 4 capsule; Refill: 0  2. Dyspepsia Counseling: Intensive lifestyle modifications  are the first line treatment for this issue. We discussed several lifestyle modifications today and she will continue to work on diet, exercise and weight loss efforts. We will continue to monitor. Orders and follow up as documented in patient record.  3. Insomnia, unspecified type Reviewed melatonin recommendations with Karina Hawkins.  She will consider trazodone.  4. Attention deficit disorder, unspecified hyperactivity presence Counseling: Intensive lifestyle modifications are the first line treatment for this issue. We discussed several lifestyle modifications today and she will continue to work on diet, exercise and weight loss efforts. We will continue to monitor. Orders and follow up as documented in patient record.  5. At risk for osteoporosis Karina Hawkins was given approximately 15 minutes of osteoporosis prevention counseling today. Karina Hawkins is at risk for osteopenia and osteoporosis due to her Vitamin D deficiency. She was encouraged to take her Vitamin D and follow her higher calcium diet and increase strengthening exercise to help strengthen her bones and decrease her risk of osteopenia and osteoporosis.  Repetitive spaced learning was employed today to elicit superior memory formation and behavioral change.  6. Class 1 obesity with serious comorbidity and body mass index (BMI) of 32.0 to 32.9 in adult, unspecified obesity type Karina Hawkins is currently in the action stage of change. As such, her goal is to continue with weight loss efforts. She has agreed to keeping a food journal and adhering to recommended goals of 1000 calories and 75+ protein.   Exercise goals: As is.  Behavioral modification strategies: increasing lean protein intake.  Karina Hawkins has agreed to follow-up with our clinic in 2  weeks. She was informed of the importance of frequent follow-up visits to maximize her success with intensive lifestyle modifications for her multiple health conditions.   Objective:   Blood pressure 116/75, pulse 72,  temperature 97.7 F (36.5 C), temperature source Oral, height 5\' 4"  (1.626 m), weight 189 lb (85.7 kg), SpO2 97 %. Body mass index is 32.44 kg/m.  General: Cooperative, alert, well developed, in no acute distress. HEENT: Conjunctivae and lids unremarkable. Cardiovascular: Regular rhythm.  Lungs: Normal work of breathing. Neurologic: No focal deficits.   Lab Results  Component Value Date   CREATININE 0.61 10/30/2019   BUN 11 10/30/2019   NA 145 (H) 10/30/2019   K 4.2 10/30/2019   CL 102 10/30/2019   CO2 24 10/30/2019   Lab Results  Component Value Date   ALT 21 10/30/2019   AST 18 10/30/2019   ALKPHOS 130 (H) 10/30/2019   BILITOT 0.4 10/30/2019   Lab Results  Component Value Date   HGBA1C 5.4 10/30/2019   HGBA1C 5.5 06/21/2019   HGBA1C 5.4 10/14/2016   HGBA1C 5.3 10/23/2015   Lab Results  Component Value Date   INSULIN 13.1 10/30/2019   Lab Results  Component Value Date   TSH 1.930 10/30/2019   Lab Results  Component Value Date   CHOL 164 10/30/2019   HDL 63 10/30/2019   LDLCALC 86 10/30/2019   TRIG 80 10/30/2019   CHOLHDL 3 06/21/2019   Lab Results  Component Value Date   WBC 6.2 10/30/2019   HGB 14.6 10/30/2019   HCT 43.5 10/30/2019   MCV 88 10/30/2019   PLT 195 10/30/2019   Lab Results  Component Value Date   IRON 67 10/30/2019   TIBC 347 10/30/2019   FERRITIN 82 10/30/2019   Attestation Statements:   Reviewed by clinician on day of visit: allergies, medications, problem list, medical history, surgical history, family history, social history, and previous encounter notes.  I, Water quality scientist, CMA, am acting as Location manager for [employer], DO.  I have reviewed the above documentation for accuracy and completeness, and I agree with the above. Briscoe Deutscher, DO

## 2019-11-29 ENCOUNTER — Other Ambulatory Visit (INDEPENDENT_AMBULATORY_CARE_PROVIDER_SITE_OTHER): Payer: 59

## 2019-11-29 ENCOUNTER — Ambulatory Visit (INDEPENDENT_AMBULATORY_CARE_PROVIDER_SITE_OTHER): Payer: 59 | Admitting: Gastroenterology

## 2019-11-29 ENCOUNTER — Encounter: Payer: Self-pay | Admitting: Gastroenterology

## 2019-11-29 VITALS — BP 121/64 | HR 68 | Temp 98.6°F | Ht 65.0 in | Wt 190.0 lb

## 2019-11-29 DIAGNOSIS — K449 Diaphragmatic hernia without obstruction or gangrene: Secondary | ICD-10-CM | POA: Diagnosis not present

## 2019-11-29 DIAGNOSIS — R748 Abnormal levels of other serum enzymes: Secondary | ICD-10-CM

## 2019-11-29 DIAGNOSIS — Z1211 Encounter for screening for malignant neoplasm of colon: Secondary | ICD-10-CM

## 2019-11-29 DIAGNOSIS — K219 Gastro-esophageal reflux disease without esophagitis: Secondary | ICD-10-CM

## 2019-11-29 DIAGNOSIS — Z01818 Encounter for other preprocedural examination: Secondary | ICD-10-CM

## 2019-11-29 DIAGNOSIS — R1310 Dysphagia, unspecified: Secondary | ICD-10-CM

## 2019-11-29 LAB — HEPATIC FUNCTION PANEL
ALT: 18 U/L (ref 0–35)
AST: 16 U/L (ref 0–37)
Albumin: 4.5 g/dL (ref 3.5–5.2)
Alkaline Phosphatase: 100 U/L (ref 39–117)
Bilirubin, Direct: 0.1 mg/dL (ref 0.0–0.3)
Total Bilirubin: 0.4 mg/dL (ref 0.2–1.2)
Total Protein: 7.3 g/dL (ref 6.0–8.3)

## 2019-11-29 LAB — GAMMA GT: GGT: 15 U/L (ref 7–51)

## 2019-11-29 MED ORDER — SUPREP BOWEL PREP KIT 17.5-3.13-1.6 GM/177ML PO SOLN: ORAL | 0 refills | Status: DC

## 2019-11-29 NOTE — Progress Notes (Signed)
HPI :  54 year old female with a history of GERD, hypertension, history of anal fissure status post surgery, referred here by Briscoe Deutscher DO to discuss colon cancer screening and history of reflux.  She states she has had reflux for a very long time.  She has symptoms of pyrosis and regurgitation, states at times " she feels like her throat is on fire".  She has had this for several years, has been on a variety of H2 blockers and PPIs over this time.  She had an EGD with Dr. Sharlett Iles in 1991 which I reviewed.  She had a 4 to 5 cm hiatal hernia at that time with evidence of " free reflux".  I am not sure what that means, there was no photographs, not clear if she had esophagitis or not.  She states over time symptoms seem to be getting worse.  She is now sleeping with head of her bed elevated due to nocturnal regurgitation.  She has clear food triggers however it does not matter really what she eats she continues to have symptoms periodically.  She had been off all PPIs for period of time and just started Nexium 40 mg once a day, this helps but she still has symptoms periodically despite this.  She has been having some dysphagia to pills and rarely to occasional foods.  She uses peppermint at as needed to help relax her esophagus.  She has some nausea at times but denies any vomiting.  No routine postprandial pains.  He has questions about the long-term management of this.  She moves her bowels regularly.  No blood in her stool on a routine basis.  She has a remote history of anal fissures and required surgery to heal these years ago.  She had a flexible sigmoidoscopy in 1994 which was reportedly normal.  She denies any family history of colon cancer or esophageal cancer.  Of note on review of her labs she has had a very mild alkaline phosphatase elevation on her last 2 blood draws over the past 5 months.  Alk phos of about 130.  Normal AST and ALT.  Historically she has had an ALT elevation in the  past that she is aware of but prior ultrasound and MRI of her abdomen showed normal liver.  Prior workup: Sigmoidoscopy  1994 - normal EGD 1991 - "free reflux" 4-5cm HH - written report, no pictures noted  MRI abdomen 07/30/2017 - adrenal adenoma, liver normal, biliary tree normal   Past Medical History:  Diagnosis Date  . Anxiety   . Edema, lower extremity   . Esophageal reflux   . GERD (gastroesophageal reflux disease)   . HTN (hypertension)   . Lower back pain   . Overweight(278.02)   . Pneumonia      Past Surgical History:  Procedure Laterality Date  . ANAL FISSURE REPAIR  1993   Dr Rosana Hoes  . FOOT SURGERY  2002   bunion repair  . LUMBAR SPINE SURGERY    . TONSILLECTOMY  Age 85   w/ Adenoids  . TUBAL LIGATION     Family History  Problem Relation Age of Onset  . Diabetes Mother   . Hypertension Mother   . Hyperlipidemia Mother   . Rheum arthritis Mother   . Stroke Mother   . Depression Mother   . Anxiety disorder Mother   . Obesity Mother   . Hypertension Father   . Hyperlipidemia Father   . Anxiety disorder Father   . Obesity  Father   . Breast cancer Maternal Grandmother   . Hypertension Maternal Grandmother   . Emphysema Maternal Grandfather   . Hypertension Maternal Grandfather   . Hypertension Paternal Grandmother   . Hypertension Paternal Grandfather    Social History   Tobacco Use  . Smoking status: Never Smoker  . Smokeless tobacco: Never Used  Substance Use Topics  . Alcohol use: Yes    Alcohol/week: 0.0 standard drinks    Comment: socially 1x per month  . Drug use: No   Current Outpatient Medications  Medication Sig Dispense Refill  . ALPRAZolam (XANAX) 0.25 MG tablet Take 1 tablet (0.25 mg total) by mouth 3 (three) times daily as needed. 90 tablet 0  . diltiazem (CARDIZEM CD) 240 MG 24 hr capsule TAKE 1 CAPSULE BY MOUTH EVERY DAY 90 capsule 1  . esomeprazole (NEXIUM) 40 MG capsule Take 1 capsule (40 mg total) by mouth daily at 12 noon. 30  capsule 2  . lisinopril-hydrochlorothiazide (ZESTORETIC) 20-12.5 MG tablet Take 1 tablet by mouth daily. 90 tablet 1  . loratadine (CLARITIN) 10 MG tablet Take 10 mg by mouth daily.    . Multiple Vitamin (MULTIVITAMIN) tablet Take 1 tablet by mouth daily.      . Vitamin D, Ergocalciferol, (DRISDOL) 1.25 MG (50000 UNIT) CAPS capsule Take 1 capsule (50,000 Units total) by mouth every 7 (seven) days. 4 capsule 0  . SUPREP BOWEL PREP KIT 17.5-3.13-1.6 GM/177ML SOLN Suprep-Use as directed 354 mL 0   No current facility-administered medications for this visit.   Allergies  Allergen Reactions  . Penicillins     Throat closes up  . Clarithromycin     Lots of nausea     Review of Systems: All systems reviewed and negative except where noted in HPI.   Lab Results  Component Value Date   WBC 6.2 10/30/2019   HGB 14.6 10/30/2019   HCT 43.5 10/30/2019   MCV 88 10/30/2019   PLT 195 10/30/2019    Lab Results  Component Value Date   CREATININE 0.61 10/30/2019   BUN 11 10/30/2019   NA 145 (H) 10/30/2019   K 4.2 10/30/2019   CL 102 10/30/2019   CO2 24 10/30/2019    Lab Results  Component Value Date   ALT 21 10/30/2019   AST 18 10/30/2019   ALKPHOS 130 (H) 10/30/2019   BILITOT 0.4 10/30/2019     Physical Exam: BP 121/64   Pulse 68   Temp 98.6 F (37 C)   Ht '5\' 5"'  (1.651 m)   Wt 190 lb (86.2 kg)   SpO2 99%   BMI 31.62 kg/m  Constitutional: Pleasant,well-developed, female in no acute distress. HEENT: Normocephalic and atraumatic. Conjunctivae are normal. No scleral icterus. Neck supple.  Cardiovascular: Normal rate, regular rhythm.  Pulmonary/chest: Effort normal and breath sounds normal. No wheezing, rales or rhonchi. Abdominal: Soft, nondistended, nontender. . There are no masses palpable.  Extremities: no edema Lymphadenopathy: No cervical adenopathy noted. Neurological: Alert and oriented to person place and time. Skin: Skin is warm and dry. No rashes  noted. Psychiatric: Normal mood and affect. Behavior is normal.   ASSESSMENT AND PLAN: 54 year old female here for new patient assessment of the following:  GERD / dysphagia / hiatal hernia - patient with longstanding history of reflux symptoms in the setting of moderate sized hiatal hernia documented on EGD almost 30 years ago.  She continues to have symptoms that bother her relatively frequently, now sleeping with the head of the bed  elevated due to nocturnal symptoms.  Her hiatal hernia is likely driving this, we discussed options moving forward.  She is concerned about damage to her esophagus from longstanding reflux in the setting of this hernia.  I think an endoscopy is reasonable, ensure no evidence of Barrett's esophagus, assess for active esophagitis, and reassess the hiatal hernia and see if it has enlarged.  If the hernia is quite large she may benefit from a surgery to repair this if she continues to have symptoms despite PPI (although recent just resumed nexium).  We discussed risk and benefits of endoscopy and anesthesia and she want to proceed.  We will continue her present dose of Nexium for now.  We will also assess her mild dysphagia symptoms and perform dilation if needed.  She agreed with the plan, further recommendations pending results.  Elevated alkaline phosphatase - very mild elevation noted on her last 2 labs, normal ALT.  Prior MRI showed normal-appearing liver.  We will repeat LFTs with [facility] and clarify where this is coming from, further work-up pending the results.  She agreed  Colon cancer screening - she is at average risk for colon cancer and is overdue for screening, recommend optical colonoscopy to further screen her.  We discussed risks and benefits and she wants to proceed, we will do this at the same time as her endoscopy.  I spent 45 minutes of time, including in depth chart review, independent review of results as outlined above, communicating results with the  patient directly, face-to-face time with the patient, coordinating care, ordering studies and medications as appropriate, and documenting this encounter.   [place] Cellar, MD [facility]  CC: Briscoe Deutscher, DO

## 2019-11-29 NOTE — Progress Notes (Signed)
3

## 2019-11-29 NOTE — Patient Instructions (Addendum)
If you are age 53 or older, your body mass index should be between 23-30. Your Body mass index is 31.62 kg/m. If this is out of the aforementioned range listed, please consider follow up with your Primary Care Provider.  If you are age 39 or younger, your body mass index should be between 19-25. Your Body mass index is 31.62 kg/m. If this is out of the aformentioned range listed, please consider follow up with your Primary Care Provider.   You have been scheduled for a colonoscopy. Please follow written instructions given to you at your visit today.  Please pick up your prep supplies at the pharmacy within the next 1-3 days. If you use inhalers (even only as needed), please bring them with you on the day of your procedure. Your physician has requested that you go to www.startemmi.com and enter the access code given to you at your visit today. This web site gives a general overview about your procedure. However, you should still follow specific instructions given to you by our office regarding your preparation for the  Please go to the lab in the basement of our building to have lab work done as you leave today. Hit "B" for basement when you get on the elevator.  When the doors open the lab is on your left.  We will call you with the results. Thank you. procedure.  Thank you for entrusting me with your care and for choosing [facility], Dr. [place] Cellar

## 2019-12-04 ENCOUNTER — Other Ambulatory Visit: Payer: Self-pay

## 2019-12-04 ENCOUNTER — Ambulatory Visit (INDEPENDENT_AMBULATORY_CARE_PROVIDER_SITE_OTHER): Payer: 59

## 2019-12-04 DIAGNOSIS — Z1159 Encounter for screening for other viral diseases: Secondary | ICD-10-CM

## 2019-12-05 ENCOUNTER — Encounter: Payer: Self-pay | Admitting: Gastroenterology

## 2019-12-05 LAB — SARS CORONAVIRUS 2 (TAT 6-24 HRS): SARS Coronavirus 2: NEGATIVE

## 2019-12-06 ENCOUNTER — Other Ambulatory Visit: Payer: Self-pay

## 2019-12-06 ENCOUNTER — Ambulatory Visit (AMBULATORY_SURGERY_CENTER): Payer: 59 | Admitting: Gastroenterology

## 2019-12-06 ENCOUNTER — Encounter: Payer: Self-pay | Admitting: Gastroenterology

## 2019-12-06 VITALS — BP 124/83 | HR 70 | Temp 96.2°F | Resp 15 | Ht 65.0 in | Wt 190.0 lb

## 2019-12-06 DIAGNOSIS — D120 Benign neoplasm of cecum: Secondary | ICD-10-CM

## 2019-12-06 DIAGNOSIS — K317 Polyp of stomach and duodenum: Secondary | ICD-10-CM | POA: Diagnosis not present

## 2019-12-06 DIAGNOSIS — D123 Benign neoplasm of transverse colon: Secondary | ICD-10-CM | POA: Diagnosis not present

## 2019-12-06 DIAGNOSIS — Z1211 Encounter for screening for malignant neoplasm of colon: Secondary | ICD-10-CM | POA: Diagnosis present

## 2019-12-06 DIAGNOSIS — K449 Diaphragmatic hernia without obstruction or gangrene: Secondary | ICD-10-CM

## 2019-12-06 DIAGNOSIS — D122 Benign neoplasm of ascending colon: Secondary | ICD-10-CM | POA: Diagnosis not present

## 2019-12-06 DIAGNOSIS — K219 Gastro-esophageal reflux disease without esophagitis: Secondary | ICD-10-CM

## 2019-12-06 DIAGNOSIS — R1310 Dysphagia, unspecified: Secondary | ICD-10-CM

## 2019-12-06 MED ORDER — SODIUM CHLORIDE 0.9 % IV SOLN: 500.0000 mL | Freq: Once | INTRAVENOUS | Status: DC

## 2019-12-06 NOTE — Progress Notes (Signed)
Temp by JB, V/S by KA   Pt's states no medical or surgical changes since previsit or office visit.

## 2019-12-06 NOTE — Progress Notes (Signed)
Report to PACU, RN, vss, BBS= Clear.  

## 2019-12-06 NOTE — Op Note (Signed)
[facility] Patient Name: Karina Hawkins Procedure Date: 12/06/2019 1:24 PM MRN: [id number] Endoscopist: Remo Lipps P. Havery Moros , MD Age: 54 Referring MD:  Date of Birth: [birth year] Gender: Female Account #: [id number] Procedure:                Colonoscopy Indications:              Screening for colorectal malignant neoplasm Medicines:                Monitored Anesthesia Care Procedure:                Pre-Anesthesia Assessment:                           - Prior to the procedure, a History and Physical                            was performed, and patient medications and                            allergies were reviewed. The patient's tolerance of                            previous anesthesia was also reviewed. The risks                            and benefits of the procedure and the sedation                            options and risks were discussed with the patient.                            All questions were answered, and informed consent                            was obtained. Prior Anticoagulants: The patient has                            taken no previous anticoagulant or antiplatelet                            agents. ASA Grade Assessment: II - A patient with                            mild systemic disease. After reviewing the risks                            and benefits, the patient was deemed in                            satisfactory condition to undergo the procedure.                           After obtaining informed consent, the colonoscope  was passed under direct vision. Throughout the                            procedure, the patient's blood pressure, pulse, and                            oxygen saturations were monitored continuously. The                            Colonoscope was introduced through the anus and                            advanced to the the cecum, identified by                            appendiceal orifice and  ileocecal valve. The                            colonoscopy was performed without difficulty. The                            patient tolerated the procedure well. The quality                            of the bowel preparation was good. The ileocecal                            valve, appendiceal orifice, and rectum were                            photographed. Scope In: 1:35:46 PM Scope Out: 1:56:05 PM Scope Withdrawal Time: 0 hours 16 minutes 13 seconds  Total Procedure Duration: 0 hours 20 minutes 19 seconds  Findings:                 The perianal and digital rectal examinations were                            normal.                           A diminutive polyp was found in the cecum. The                            polyp was sessile. The polyp was removed with a                            cold snare. Resection and retrieval were complete.                           A 3 to 4 mm polyp was found in the ascending colon.                            The polyp was sessile. The polyp was removed with a  cold snare. Resection and retrieval were complete.                           A 4 mm polyp was found in the transverse colon. The                            polyp was sessile. The polyp was removed with a                            cold snare. Resection and retrieval were complete.                           Anal papilla(e) were hypertrophied.                           The exam was otherwise without abnormality. Complications:            No immediate complications. Estimated blood loss:                            Minimal. Estimated Blood Loss:     Estimated blood loss was minimal. Impression:               - One diminutive polyp in the cecum, removed with a                            cold snare. Resected and retrieved.                           - One 3 to 4 mm polyp in the ascending colon,                            removed with a cold snare. Resected and retrieved.                            - One 4 mm polyp in the transverse colon, removed                            with a cold snare. Resected and retrieved.                           - Anal papilla(e) were hypertrophied.                           - The examination was otherwise normal. Recommendation:           - Patient has a contact number available for                            emergencies. The signs and symptoms of potential                            delayed complications were discussed with the  patient. Return to normal activities tomorrow.                            Written discharge instructions were provided to the                            patient.                           - Resume previous diet.                           - Continue present medications.                           - Await pathology results. Remo Lipps P. Duanna Runk, MD 12/06/2019 2:00:24 PM This report has been signed electronically.

## 2019-12-06 NOTE — Progress Notes (Signed)
Called to room to assist during endoscopic procedure.  Patient ID and intended procedure confirmed with present staff. Received instructions for my participation in the procedure from the performing physician.  

## 2019-12-06 NOTE — Patient Instructions (Signed)
Please, read all of the handouts given to you by your recovery room nurse.. You have a special brochure regarding the TIF procedure.  Thank-you for choosing [place] for your healthcare needs today.  YOU HAD AN ENDOSCOPIC PROCEDURE TODAY AT [facility]:   Refer to the procedure report that was given to you for any specific questions about what was found during the examination.  If the procedure report does not answer your questions, please call your gastroenterologist to clarify.  If you requested that your care partner not be given the details of your procedure findings, then the procedure report has been included in a sealed envelope for you to review at your convenience later.  YOU SHOULD EXPECT: Some feelings of bloating in the abdomen. Passage of more gas than usual.  Walking can help get rid of the air that was put into your GI tract during the procedure and reduce the bloating. If you had a lower endoscopy (such as a colonoscopy or flexible sigmoidoscopy) you may notice spotting of blood in your stool or on the toilet paper. If you underwent a bowel prep for your procedure, you may not have a normal bowel movement for a few days.  Please Note:  You might notice some irritation and congestion in your nose or some drainage.  This is from the oxygen used during your procedure.  There is no need for concern and it should clear up in a day or so.  SYMPTOMS TO REPORT IMMEDIATELY:   Following lower endoscopy (colonoscopy or flexible sigmoidoscopy):  Excessive amounts of blood in the stool  Significant tenderness or worsening of abdominal pains  Swelling of the abdomen that is new, acute  Fever of 100F or higher   Following upper endoscopy (EGD)  Vomiting of blood or coffee ground material  New chest pain or pain under the shoulder blades  Painful or persistently difficult swallowing  New shortness of breath  Fever of 100F or higher  Black, tarry-looking stools  For urgent or  emergent issues, a gastroenterologist can be reached at any hour by calling [phone]. Do not use MyChart messaging for urgent concerns.    DIET:  We do recommend a small meal at first, but then you may proceed to your regular diet.  Drink plenty of fluids but you should avoid alcoholic beverages for 24 hours.  ACTIVITY:  You should plan to take it easy for the rest of today and you should NOT DRIVE or use heavy machinery until tomorrow (because of the sedation medicines used during the test).    FOLLOW UP: Our staff will call the number listed on your records 48-72 hours following your procedure to check on you and address any questions or concerns that you may have regarding the information given to you following your procedure. If we do not reach you, we will leave a message.  We will attempt to reach you two times.  During this call, we will ask if you have developed any symptoms of COVID 19. If you develop any symptoms (ie: fever, flu-like symptoms, shortness of breath, cough etc.) before then, please call [phone].  If you test positive for Covid 19 in the 2 weeks post procedure, please call and report this information to [place].    If any biopsies were taken you will be contacted by phone or by letter within the next 1-3 weeks.  Please call us at [phone] if you have not heard about the biopsies in 3 weeks.  SIGNATURES/CONFIDENTIALITY: You and/or your care partner have signed paperwork which will be entered into your electronic medical record.  These signatures attest to the fact that that the information above on your After Visit Summary has been reviewed and is understood.  Full responsibility of the confidentiality of this discharge information lies with you and/or your care-partner.

## 2019-12-06 NOTE — Op Note (Signed)
[facility] Patient Name: Karina Hawkins Procedure Date: 12/06/2019 1:24 PM MRN: [id number] Endoscopist: Remo Lipps P. Havery Moros , MD Age: 54 Referring MD:  Date of Birth: 06-10-1966 Gender: Female Account #: [id number] Procedure:                Upper GI endoscopy Indications:              mild dysphagia, Follow-up of gastro-esophageal                            reflux disease - history of multiple PPIs in the                            past, recurrence of symptoms off meds, now on                            nexium with some breakthrough at times Medicines:                Monitored Anesthesia Care Procedure:                Pre-Anesthesia Assessment:                           - Prior to the procedure, a History and Physical                            was performed, and patient medications and                            allergies were reviewed. The patient's tolerance of                            previous anesthesia was also reviewed. The risks                            and benefits of the procedure and the sedation                            options and risks were discussed with the patient.                            All questions were answered, and informed consent                            was obtained. Prior Anticoagulants: The patient has                            taken no previous anticoagulant or antiplatelet                            agents. ASA Grade Assessment: II - A patient with                            mild systemic disease. After reviewing the risks  and benefits, the patient was deemed in                            satisfactory condition to undergo the procedure.                           After obtaining informed consent, the endoscope was                            passed under direct vision. Throughout the                            procedure, the patient's blood pressure, pulse, and                            oxygen saturations  were monitored continuously. The                            Endoscope was introduced through the mouth, and                            advanced to the second part of duodenum. The upper                            GI endoscopy was accomplished without difficulty.                            The patient tolerated the procedure well. Scope In: Scope Out: Findings:                 Esophagogastric landmarks were identified: the                            Z-line was found at 38 cm, the gastroesophageal                            junction was found at 38 cm and the upper extent of                            the gastric folds was found at 39 cm from the                            incisors.                           A 1 cm hiatal hernia was present.                           The exam of the esophagus was otherwise normal. No                            obvious stenosis / stricture appreciated. Dilation  not performed.                           Biopsies were taken with a cold forceps in the                            upper third of the esophagus, in the middle third                            of the esophagus and in the lower third of the                            esophagus for histology.                           A few small sessile polyps were found in the                            gastric fundus and in the gastric body, grossly                            consistent with benign fundic gland polyps.                            Biopsies were taken with a cold forceps for                            histology.                           The exam of the stomach was otherwise normal. Hill                            grade II of the cardia when viewed in retroflexed                            position.                           The duodenal bulb and second portion of the                            duodenum were normal. Complications:            No immediate complications.  Estimated blood loss:                            Minimal. Estimated Blood Loss:     Estimated blood loss was minimal. Impression:               - Esophagogastric landmarks identified.                           - 1 cm hiatal hernia.                           -  Normal esophagus otherwise - no stenosis /                            stricture or inflammatory changes. Biopsies                            obtained to rule out EoE.                           - A few suspected benign fundic gland gastric                            polyps. Biopsied.                           - Normal stomach otherwise                           - Normal duodenal bulb and second portion of the                            duodenum. Recommendation:           - Patient has a contact number available for                            emergencies. The signs and symptoms of potential                            delayed complications were discussed with the                            patient. Return to normal activities tomorrow.                            Written discharge instructions were provided to the                            patient.                           - Resume previous diet.                           - Continue present medications.                           - Await pathology results.                           - Patient is a candidate for endoscopic reflux                            treatment (TIF) if interested in coming off PPI,                            will discuss with the patient Remo Lipps  Daiva Huge, MD 12/06/2019 2:09:07 PM This report has been signed electronically.

## 2019-12-10 ENCOUNTER — Telehealth: Payer: Self-pay

## 2019-12-10 NOTE — Telephone Encounter (Signed)
  Follow up Call-  Call back number 12/06/2019  Post procedure Call Back phone  # [phone]  Permission to leave phone message Yes  Some recent data might be hidden     Patient questions:  Do you have a fever, pain , or abdominal swelling? No. Pain Score  0 *  Have you tolerated food without any problems? Yes.    Have you been able to return to your normal activities? Yes.    Do you have any questions about your discharge instructions: Diet   No. Medications  No. Follow up visit  No.  Do you have questions or concerns about your Care? No.  Actions: * If pain score is 4 or above: No action needed, pain <4.  1. Have you developed a fever since your procedure? No  2.   Have you had an respiratory symptoms (SOB or cough) since your procedure? No 3.   Have you tested positive for COVID 19 since your procedure No 4.   Have you had any family members/close contacts diagnosed with the COVID 19 since your procedure?  No  If yes to any of these questions please route to Joylene John, RN and Alphonsa Gin, RN.

## 2019-12-19 ENCOUNTER — Other Ambulatory Visit: Payer: Self-pay

## 2019-12-19 ENCOUNTER — Encounter (INDEPENDENT_AMBULATORY_CARE_PROVIDER_SITE_OTHER): Payer: Self-pay | Admitting: Family Medicine

## 2019-12-19 ENCOUNTER — Ambulatory Visit (INDEPENDENT_AMBULATORY_CARE_PROVIDER_SITE_OTHER): Payer: 59 | Admitting: Family Medicine

## 2019-12-19 VITALS — BP 102/70 | HR 78 | Temp 98.4°F | Ht 64.0 in | Wt 182.0 lb

## 2019-12-19 DIAGNOSIS — E559 Vitamin D deficiency, unspecified: Secondary | ICD-10-CM | POA: Diagnosis not present

## 2019-12-19 DIAGNOSIS — E669 Obesity, unspecified: Secondary | ICD-10-CM | POA: Diagnosis not present

## 2019-12-19 DIAGNOSIS — Z6831 Body mass index (BMI) 31.0-31.9, adult: Secondary | ICD-10-CM | POA: Diagnosis not present

## 2019-12-19 DIAGNOSIS — G4700 Insomnia, unspecified: Secondary | ICD-10-CM | POA: Diagnosis not present

## 2019-12-19 DIAGNOSIS — R1013 Epigastric pain: Secondary | ICD-10-CM | POA: Diagnosis not present

## 2019-12-19 DIAGNOSIS — F988 Other specified behavioral and emotional disorders with onset usually occurring in childhood and adolescence: Secondary | ICD-10-CM

## 2019-12-19 MED ORDER — TRAZODONE HCL 50 MG PO TABS: 25.0000 mg | ORAL_TABLET | Freq: Every evening | ORAL | 0 refills | Status: DC | PRN

## 2019-12-19 NOTE — Progress Notes (Signed)
Chief Complaint:   OBESITY Karina Hawkins is here to discuss her progress with her obesity treatment plan along with follow-up of her obesity related diagnoses. Karina Hawkins is on the Category 1 Plan and states she is following her eating plan approximately 70% of the time. Karina Hawkins states she is walking 10,000 steps.  Today's visit was #: 4 Starting weight: 196 lbs Starting date: 10/30/2019 Today's weight: 182 lbs Today's date: 12/19/2019 Total lbs lost to date: 14 lbs Total lbs lost since last in-office visit: 4 lbs  Interim History: Karina Hawkins says she is drinking 72-84 ounces of water per day.  She has had a colonoscopy since last visit.  She has provided the following food recall:  Breakfast:  Egg white or egg and pico and cheese.  Half & half in her coffee. Lunch:  Tuna, salads. Dinner:  Eating more seafood/meat. Snack:  Oikos 20 gram protein yogurt.  Subjective:   1. Dyspepsia She has been experiencing dyspepsia. She takes Nexium 40 mg daily.  2. Attention deficit disorder Karina Hawkins has the diagnosis of ADD.  She does not take medication for this at this time.  3. Insomnia, unspecified type Symptoms include: has difficulty falling asleep.   4. Vitamin D deficiency Karina Hawkins Vitamin D level was 32.3 on 10/30/2019. She is currently taking vit D. She denies nausea, vomiting or muscle weakness.  Assessment/Plan:   1. Dyspepsia Intensive lifestyle modifications are the first line treatment for this issue. We discussed several lifestyle modifications today and she will continue to work on diet, exercise and weight loss efforts. Orders and follow up as documented in patient record.   Counseling . If a person has gastroesophageal reflux disease (GERD), food and stomach acid move back up into the esophagus and cause symptoms or problems such as damage to the esophagus. . Anti-reflux measures include: raising the head of the bed, avoiding tight clothing or belts, avoiding eating late at night, not lying down  shortly after mealtime, and achieving weight loss. . Avoid ASA, NSAID's, caffeine, alcohol, and tobacco.  . OTC Pepcid and/or Tums are often very helpful for as needed use.  Marland Kitchen However, for persisting chronic or daily symptoms, stronger medications like Omeprazole may be needed. . You may need to avoid foods and drinks such as: ? Coffee and tea (with or without caffeine). ? Drinks that contain alcohol. ? Energy drinks and sports drinks. ? Bubbly (carbonated) drinks or sodas. ? Chocolate and cocoa. ? Peppermint and mint flavorings. ? Garlic and onions. ? Horseradish. ? Spicy and acidic foods. These include peppers, chili powder, curry powder, vinegar, hot sauces, and BBQ sauce. ? Citrus fruit juices and citrus fruits, such as oranges, lemons, and limes. ? Tomato-based foods. These include red sauce, chili, salsa, and pizza with red sauce. ? Fried and fatty foods. These include donuts, french fries, potato chips, and high-fat dressings. ? High-fat meats. These include hot dogs, rib eye steak, sausage, ham, and bacon.  2. Attention deficit disorder, unspecified hyperactivity presence We will continue to monitor. Orders and follow up as documented in patient record.  3. Insomnia The problem of recurrent insomnia was discussed. Orders and follow up as documented in patient record. Counseling: Intensive lifestyle modifications are the first line treatment for this issue. We discussed several lifestyle modifications today and she will continue to work on diet, exercise and weight loss efforts.   Counseling  Limit or avoid alcohol, caffeinated beverages, and cigarettes, especially close to bedtime.   Do not eat a large  meal or eat spicy foods right before bedtime. This can lead to digestive discomfort that can make it hard for you to sleep.  Keep a sleep diary to help you and your health care provider figure out what could be causing your insomnia.  . Make your bedroom a dark, comfortable  place where it is easy to fall asleep. ? Put up shades or blackout curtains to block light from outside. ? Use a white noise machine to block noise. ? Keep the temperature cool. . Limit screen use before bedtime. This includes: ? Watching TV. ? Using your smartphone, tablet, or computer. . Stick to a routine that includes going to bed and waking up at the same times every day and night. This can help you fall asleep faster. Consider making a quiet activity, such as reading, part of your nighttime routine. . Try to avoid taking naps during the day so that you sleep better at night. . Get out of bed if you are still awake after 15 minutes of trying to sleep. Keep the lights down, but try reading or doing a quiet activity. When you feel sleepy, go back to bed.  Orders - traZODone (DESYREL) 50 MG tablet; Take 0.5-1 tablets (25-50 mg total) by mouth at bedtime as needed for sleep.  Dispense: 30 tablet; Refill: 0  4. Vitamin D deficiency Low Vitamin D level contributes to fatigue and are associated with obesity, breast, and colon cancer. She agrees to continue to take prescription Vitamin D @50 ,000 IU every week and will follow-up for routine testing of Vitamin D, at least 2-3 times per year to avoid over-replacement.  5. Class 1 obesity with serious comorbidity and body mass index (BMI) of 31.0 to 31.9 in adult, unspecified obesity type Karina Hawkins is currently in the action stage of change. As such, her goal is to continue with weight loss efforts. She has agreed to the Category 1 Plan.   Exercise goals: As is.  Behavioral modification strategies: increasing lean protein intake.  Karina Hawkins has agreed to follow-up with our clinic in 2 weeks. She was informed of the importance of frequent follow-up visits to maximize her success with intensive lifestyle modifications for her multiple health conditions.   Objective:   Blood pressure 102/70, pulse 78, temperature 98.4 F (36.9 C), temperature source Oral,  height 5\' 4"  (1.626 m), weight 182 lb (82.6 kg), SpO2 99 %. Body mass index is 31.24 kg/m.  General: Cooperative, alert, well developed, in no acute distress. HEENT: Conjunctivae and lids unremarkable. Cardiovascular: Regular rhythm.  Lungs: Normal work of breathing. Neurologic: No focal deficits.   Lab Results  Component Value Date   CREATININE 0.61 10/30/2019   BUN 11 10/30/2019   NA 145 (H) 10/30/2019   K 4.2 10/30/2019   CL 102 10/30/2019   CO2 24 10/30/2019   Lab Results  Component Value Date   ALT 18 11/29/2019   AST 16 11/29/2019   ALKPHOS 100 11/29/2019   BILITOT 0.4 11/29/2019   Lab Results  Component Value Date   HGBA1C 5.4 10/30/2019   HGBA1C 5.5 06/21/2019   HGBA1C 5.4 10/14/2016   HGBA1C 5.3 10/23/2015   Lab Results  Component Value Date   INSULIN 13.1 10/30/2019   Lab Results  Component Value Date   TSH 1.930 10/30/2019   Lab Results  Component Value Date   CHOL 164 10/30/2019   HDL 63 10/30/2019   LDLCALC 86 10/30/2019   TRIG 80 10/30/2019   CHOLHDL 3 06/21/2019  Lab Results  Component Value Date   WBC 6.2 10/30/2019   HGB 14.6 10/30/2019   HCT 43.5 10/30/2019   MCV 88 10/30/2019   PLT 195 10/30/2019   Lab Results  Component Value Date   IRON 67 10/30/2019   TIBC 347 10/30/2019   FERRITIN 82 10/30/2019   Attestation Statements:   Reviewed by clinician on day of visit: allergies, medications, problem list, medical history, surgical history, family history, social history, and previous encounter notes.  I, Water quality scientist, CMA, am acting as Location manager for [employer], DO.  I have reviewed the above documentation for accuracy and completeness, and I agree with the above. Briscoe Deutscher, DO

## 2019-12-20 ENCOUNTER — Encounter: Payer: Managed Care, Other (non HMO) | Admitting: Family Medicine

## 2020-01-08 ENCOUNTER — Ambulatory Visit (INDEPENDENT_AMBULATORY_CARE_PROVIDER_SITE_OTHER): Payer: 59 | Admitting: Family Medicine

## 2020-01-28 ENCOUNTER — Ambulatory Visit (INDEPENDENT_AMBULATORY_CARE_PROVIDER_SITE_OTHER): Payer: 59 | Admitting: Family Medicine

## 2020-04-05 ENCOUNTER — Other Ambulatory Visit: Payer: Self-pay | Admitting: Family Medicine

## 2020-04-05 DIAGNOSIS — I10 Essential (primary) hypertension: Secondary | ICD-10-CM

## 2020-04-22 ENCOUNTER — Other Ambulatory Visit: Payer: Self-pay | Admitting: Family Medicine

## 2020-04-22 DIAGNOSIS — I10 Essential (primary) hypertension: Secondary | ICD-10-CM

## 2020-04-23 ENCOUNTER — Other Ambulatory Visit: Payer: Self-pay

## 2020-04-23 DIAGNOSIS — I10 Essential (primary) hypertension: Secondary | ICD-10-CM

## 2020-04-23 MED ORDER — LISINOPRIL-HYDROCHLOROTHIAZIDE 20-12.5 MG PO TABS: 1.0000 | ORAL_TABLET | Freq: Every day | ORAL | 0 refills | Status: DC

## 2020-05-24 ENCOUNTER — Other Ambulatory Visit: Payer: Self-pay | Admitting: Family Medicine

## 2020-05-24 DIAGNOSIS — I10 Essential (primary) hypertension: Secondary | ICD-10-CM

## 2020-12-03 ENCOUNTER — Other Ambulatory Visit: Payer: Self-pay

## 2020-12-03 DIAGNOSIS — I10 Essential (primary) hypertension: Secondary | ICD-10-CM

## 2020-12-03 MED ORDER — LISINOPRIL-HYDROCHLOROTHIAZIDE 20-12.5 MG PO TABS: 1.0000 | ORAL_TABLET | Freq: Every day | ORAL | 0 refills | Status: AC

## 2020-12-03 MED ORDER — DILTIAZEM HCL ER COATED BEADS 240 MG PO CP24: ORAL_CAPSULE | ORAL | 0 refills | Status: AC

## 2022-02-15 ENCOUNTER — Other Ambulatory Visit: Payer: Self-pay | Admitting: Obstetrics & Gynecology

## 2022-02-15 DIAGNOSIS — R928 Other abnormal and inconclusive findings on diagnostic imaging of breast: Secondary | ICD-10-CM

## 2022-02-24 ENCOUNTER — Ambulatory Visit
Admission: RE | Admit: 2022-02-24 | Discharge: 2022-02-24 | Disposition: A | Discharge status: Home / Self Care | Payer: 59 | Source: Ambulatory Visit | Attending: Obstetrics & Gynecology | Admitting: Obstetrics & Gynecology

## 2022-02-24 ENCOUNTER — Other Ambulatory Visit: Payer: Self-pay | Admitting: Obstetrics & Gynecology

## 2022-02-24 ENCOUNTER — Ambulatory Visit
Admission: RE | Admit: 2022-02-24 | Discharge: 2022-02-24 | Disposition: A | Discharge status: Home / Self Care | Payer: Managed Care, Other (non HMO) | Source: Ambulatory Visit | Attending: Obstetrics & Gynecology | Admitting: Obstetrics & Gynecology

## 2022-02-24 DIAGNOSIS — R928 Other abnormal and inconclusive findings on diagnostic imaging of breast: Secondary | ICD-10-CM

## 2022-02-24 DIAGNOSIS — N6489 Other specified disorders of breast: Secondary | ICD-10-CM

## 2022-05-12 ENCOUNTER — Encounter (INDEPENDENT_AMBULATORY_CARE_PROVIDER_SITE_OTHER): Payer: Self-pay

## 2022-08-30 ENCOUNTER — Ambulatory Visit: Payer: 59

## 2022-08-30 ENCOUNTER — Ambulatory Visit
Admission: RE | Admit: 2022-08-30 | Discharge: 2022-08-30 | Disposition: A | Discharge status: Home / Self Care | Payer: Managed Care, Other (non HMO) | Source: Ambulatory Visit | Attending: Obstetrics & Gynecology | Admitting: Obstetrics & Gynecology

## 2022-08-30 DIAGNOSIS — N6489 Other specified disorders of breast: Secondary | ICD-10-CM

## 2023-02-15 ENCOUNTER — Other Ambulatory Visit: Payer: Self-pay | Admitting: Obstetrics & Gynecology

## 2023-02-15 DIAGNOSIS — N6489 Other specified disorders of breast: Secondary | ICD-10-CM

## 2023-06-13 ENCOUNTER — Ambulatory Visit
Admission: RE | Admit: 2023-06-13 | Discharge: 2023-06-13 | Disposition: A | Discharge status: Home / Self Care | Payer: Managed Care, Other (non HMO) | Source: Ambulatory Visit | Attending: Obstetrics & Gynecology | Admitting: Obstetrics & Gynecology

## 2023-06-13 DIAGNOSIS — N6489 Other specified disorders of breast: Secondary | ICD-10-CM

## 2023-08-23 ENCOUNTER — Encounter: Payer: Self-pay | Admitting: Cardiology

## 2023-08-23 ENCOUNTER — Ambulatory Visit: Payer: Managed Care, Other (non HMO) | Attending: Cardiology | Admitting: Cardiology

## 2023-08-23 VITALS — BP 122/92 | HR 69 | Ht 65.0 in | Wt 212.6 lb

## 2023-08-23 DIAGNOSIS — Z01812 Encounter for preprocedural laboratory examination: Secondary | ICD-10-CM

## 2023-08-23 DIAGNOSIS — R0683 Snoring: Secondary | ICD-10-CM | POA: Diagnosis not present

## 2023-08-23 DIAGNOSIS — R002 Palpitations: Secondary | ICD-10-CM | POA: Diagnosis not present

## 2023-08-23 DIAGNOSIS — R072 Precordial pain: Secondary | ICD-10-CM

## 2023-08-23 DIAGNOSIS — R0609 Other forms of dyspnea: Secondary | ICD-10-CM | POA: Diagnosis not present

## 2023-08-23 MED ORDER — METOPROLOL TARTRATE 100 MG PO TABS: ORAL_TABLET | ORAL | 0 refills | Status: DC

## 2023-08-23 NOTE — Progress Notes (Signed)
Cardiology Office Note:    Date:  08/23/2023   ID:  Karina Hawkins, DOB Nov 24, 1965, MRN [id number]  PCP:  Charlane Ferretti, DO  Cardiologist:  Thomasene Ripple, DO  Electrophysiologist:  None   Referring MD: Charlane Ferretti, DO   " I am having chest pain"  History of Present Illness:    Karina Hawkins is a 57 y.o. female with a hx of hypertension, GERD, anxiety here today to be evaluated chest pain. She presents with intermittent chest pain, heart palpitations, and shortness of breath. The chest pain is described as a "grabbing" sensation that lasts for about 30 seconds and occurs once or twice a month. The patient also experiences heart palpitations, described as a racing heart or feeling like the heart is skipping a beat. These episodes can occur at any time, not necessarily related to physical exertion, and are sometimes accompanied by a clammy feeling and shortness of breath. The patient has learned to manage these episodes by breathing through them and trying to stay calm.  The patient also reports feeling lightheaded and dizzy at times, but it is unclear whether this is related to the heart symptoms or possibly due to low blood pressure. The patient has a history of high blood pressure since the age of 21, but reports that it can sometimes drop low. The patient also experiences shortness of breath when walking uphill, but attributes this to being out of shape rather than a heart condition.  The patient has a history of wearing a heart monitor several years ago due to similar symptoms, but did not receive any feedback from it. The patient is currently on treatment for GERD and takes Xanax for anxiety. The patient's family history includes high blood pressure and diabetes, but no known heart disease.   Past Medical History:  Diagnosis Date   Anxiety    Edema, lower extremity    Esophageal reflux    GERD (gastroesophageal reflux disease)    HTN (hypertension)    Lower back pain     Overweight(278.02)    Pneumonia     Past Surgical History:  Procedure Laterality Date   ANAL FISSURE REPAIR  1993   Dr Earlene Plater   FOOT SURGERY  2002   bunion repair   LUMBAR SPINE SURGERY     TONSILLECTOMY  Age 38   w/ Adenoids   TUBAL LIGATION      Current Medications: Current Meds  Medication Sig   diltiazem (CARDIZEM CD) 240 MG 24 hr capsule TAKE 1 CAPSULE BY MOUTH EVERY DAY   lisinopril-hydrochlorothiazide (ZESTORETIC) 20-12.5 MG tablet Take 1 tablet by mouth daily. Pt needs OV for further refills   metoprolol tartrate (LOPRESSOR) 100 MG tablet Take 2 hours prior to CT   pantoprazole (PROTONIX) 40 MG tablet Take 40 mg by mouth daily.     Allergies:   Penicillins and Clarithromycin   Social History   Socioeconomic History   Marital status: Married    Spouse name: Lewis   Number of children: 2   Years of education: Not on file   Highest education level: Not on file  Occupational History   Occupation: Armed forces operational officer: [employer]  Tobacco Use   Smoking status: Never   Smokeless tobacco: Never  Substance and Sexual Activity   Alcohol use: Yes    Alcohol/week: 0.0 standard drinks of alcohol    Comment: socially 1x per month   Drug use: No   Sexual activity: Yes  Partners: Male  Other Topics Concern   Not on file  Social History Narrative   Exercise--no   Social Determinants of Health   Financial Resource Strain: Not on file  Food Insecurity: Not on file  Transportation Needs: Not on file  Physical Activity: Not on file  Stress: Not on file  Social Connections: Not on file     Family History: The patient's family history includes Anxiety disorder in her father and mother; Breast cancer in her maternal aunt, maternal grandmother, and paternal grandmother; Depression in her mother; Diabetes in her mother; Emphysema in her maternal grandfather; Hyperlipidemia in her father and mother; Hypertension in her father, maternal grandfather, maternal  grandmother, mother, paternal grandfather, and paternal grandmother; Obesity in her father and mother; Rheum arthritis in her mother; Stroke in her mother. There is no history of Colon cancer, Rectal cancer, Stomach cancer, or Esophageal cancer.  ROS:   Review of Systems  Constitution: Negative for decreased appetite, fever and weight gain.  HENT: Negative for congestion, ear discharge, hoarse voice and sore throat.   Eyes: Negative for discharge, redness, vision loss in right eye and visual halos.  Cardiovascular: Negative for chest pain, dyspnea on exertion, leg swelling, orthopnea and palpitations.  Respiratory: Negative for cough, hemoptysis, shortness of breath and snoring.   Endocrine: Negative for heat intolerance and polyphagia.  Hematologic/Lymphatic: Negative for bleeding problem. Does not bruise/bleed easily.  Skin: Negative for flushing, nail changes, rash and suspicious lesions.  Musculoskeletal: Negative for arthritis, joint pain, muscle cramps, myalgias, neck pain and stiffness.  Gastrointestinal: Negative for abdominal pain, bowel incontinence, diarrhea and excessive appetite.  Genitourinary: Negative for decreased libido, genital sores and incomplete emptying.  Neurological: Negative for brief paralysis, focal weakness, headaches and loss of balance.  Psychiatric/Behavioral: Negative for altered mental status, depression and suicidal ideas.  Allergic/Immunologic: Negative for HIV exposure and persistent infections.    EKGs/Labs/Other Studies Reviewed:    The following studies were reviewed today:   EKG:  The ekg ordered today demonstrates   Recent Labs: No results found for requested labs within last 365 days.  Recent Lipid Panel    Component Value Date/Time   CHOL 164 10/30/2019 1251   TRIG 80 10/30/2019 1251   HDL 63 10/30/2019 1251   CHOLHDL 3 06/21/2019 1612   VLDL 12.4 06/21/2019 1612   LDLCALC 86 10/30/2019 1251    Physical Exam:    VS:  BP (!) 122/92  (BP Location: Left Arm, Patient Position: Sitting, Cuff Size: Large)   Pulse 69   Ht 5\' 5"  (1.651 m)   Wt 212 lb 9.6 oz (96.4 kg)   LMP 09/23/2015   SpO2 97%   BMI 35.38 kg/m     Wt Readings from Last 3 Encounters:  08/23/23 212 lb 9.6 oz (96.4 kg)  12/19/19 182 lb (82.6 kg)  12/06/19 190 lb (86.2 kg)     GEN: Well nourished, well developed in no acute distress HEENT: Normal NECK: No JVD; No carotid bruits LYMPHATICS: No lymphadenopathy CARDIAC: S1S2 noted,RRR, no murmurs, rubs, gallops RESPIRATORY:  Clear to auscultation without rales, wheezing or rhonchi  ABDOMEN: Soft, non-tender, non-distended, +bowel sounds, no guarding. EXTREMITIES: No edema, No cyanosis, no clubbing MUSCULOSKELETAL:  No deformity  SKIN: Warm and dry NEUROLOGIC:  Alert and oriented x 3, non-focal PSYCHIATRIC:  Normal affect, good insight  ASSESSMENT:    1. Palpitations   2. Precordial pain   3. Hawkins (dyspnea on exertion)   4. Snoring   5. Pre-procedure  lab exam    PLAN:    Chest Pain Intermittent chest pain, possibly related to GERD or anxiety. History of high blood pressure. No prior cardiac imaging. -Order CT scan to screen for heart disease.  Shortness of Breath Possible deconditioning, high blood pressure, or sleep apnea. -Order sleep study to screen for sleep apnea. -Order echocardiogram to assess cardiac structure and function.  Hypertension Long-standing hypertension, with recent low readings. -Request patient to monitor blood pressure twice daily for two weeks and upload readings to MyChart.  Venous Insufficiency Tender lower extremities, possibly related to venous insufficiency. -Continue monitoring symptoms.  Follow-up in 16 weeks to review results of ordered tests and ongoing management.  The patient is in agreement with the above plan. The patient left the office in stable condition.  The patient will follow up in   Medication Adjustments/Labs and Tests Ordered: Current  medicines are reviewed at length with the patient today.  Concerns regarding medicines are outlined above.  Orders Placed This Encounter  Procedures   CT CORONARY MORPH W/CTA COR W/SCORE W/CA W/CM &/OR WO/CM   Comprehensive Metabolic Panel (CMET)   Magnesium   EKG 12-Lead   ECHOCARDIOGRAM COMPLETE   Itamar Sleep Study   Meds ordered this encounter  Medications   metoprolol tartrate (LOPRESSOR) 100 MG tablet    Sig: Take 2 hours prior to CT    Dispense:  1 tablet    Refill:  0    Patient Instructions  Medication Instructions:  Your physician recommends that you continue on your current medications as directed. Please refer to the Current Medication list given to you today.  *If you need a refill on your cardiac medications before your next appointment, please call your pharmacy*   Lab Work: CMET, Mag If you have labs (blood work) drawn today and your tests are completely normal, you will receive your results only by: MyChart Message (if you have MyChart) OR A paper copy in the mail If you have any lab test that is abnormal or we need to change your treatment, we will call you to review the results.   Testing/Procedures: Your physician has requested that you have an echocardiogram. Echocardiography is a painless test that uses sound waves to create images of your heart. It provides your doctor with information about the size and shape of your heart and how well your heart's chambers and valves are working. This procedure takes approximately one hour. There are no restrictions for this procedure. Please do NOT wear cologne, perfume, aftershave, or lotions (deodorant is allowed). Please arrive 15 minutes prior to your appointment time.  Please note: We ask at that you not bring children with you during ultrasound (echo/ vascular) testing. Due to room size and safety concerns, children are not allowed in the ultrasound rooms during exams. Our front office staff cannot provide  observation of children in our lobby area while testing is being conducted. An adult accompanying a patient to their appointment will only be allowed in the ultrasound room at the discretion of the ultrasound technician under special circumstances. We apologize for any inconvenience.    Your cardiac CT will be scheduled at one of the below locations:   [facility] [address] [place], [place] [address] [phone]  If scheduled at [facility], please arrive at the [facility] and Children's Entrance (Entrance C2) of [facility] 30 minutes prior to test start time. You can use the FREE valet parking offered at entrance C (encouraged  to control the heart rate for the test)  Proceed to the [facility] Radiology Department (first floor) to check-in and test prep.  All radiology patients and guests should use entrance C2 at [facility], accessed from [facility], even though the hospital's physical address listed is [address]     Please follow these instructions carefully (unless otherwise directed):  An IV will be required for this test and Nitroglycerin will be given.   On the Night Before the Test: Be sure to Drink plenty of water. Do not consume any caffeinated/decaffeinated beverages or chocolate 12 hours prior to your test. Do not take any antihistamines 12 hours prior to your test.  On the Day of the Test: Drink plenty of water until 1 hour prior to the test. Do not eat any food 1 hour prior to test. You may take your regular medications prior to the test.  Take metoprolol (Lopressor) two hours prior to test. If you take Furosemide/Hydrochlorothiazide/Spironolactone, please HOLD on the morning of the test. FEMALES- please wear underwire-free bra if available, avoid dresses & tight clothing  After the Test: Drink plenty of water. After receiving IV contrast, you may experience a mild flushed feeling. This is  normal. On occasion, you may experience a mild rash up to 24 hours after the test. This is not dangerous. If this occurs, you can take Benadryl 25 mg and increase your fluid intake. If you experience trouble breathing, this can be serious. If it is severe call 911 IMMEDIATELY. If it is mild, please call our office. If you take any of these medications: Glipizide/Metformin, Avandament, Glucavance, please do not take 48 hours after completing test unless otherwise instructed.  We will call to schedule your test 2-4 weeks out understanding that some insurance companies will need an authorization prior to the service being performed.   For more information and frequently asked questions, please visit our website : [email or web address]  For non-scheduling related questions, please contact the cardiac imaging nurse navigator should you have any questions/concerns: Cardiac Imaging Nurse Navigators Direct Office Dial: [phone]   For scheduling needs, including cancellations and rescheduling, please call [place], [phone].    Follow-Up: At [facility], you and your health needs are our priority.  As part of our continuing mission to provide you with exceptional heart care, we have created designated Provider Care Teams.  These Care Teams include your primary Cardiologist (physician) and Advanced Practice Providers (APPs -  Physician Assistants and Nurse Practitioners) who all work together to provide you with the care you need, when you need it.   Your next appointment:   16 week(s)  Provider:   Thomasene Ripple, DO     Other Instructions Please take your blood pressure twice daily for 2 weeks and send in a MyChart message. Please include heart rates.   HOW TO TAKE YOUR BLOOD PRESSURE: Rest 5 minutes before taking your blood pressure. Don't smoke or drink caffeinated beverages for at least 30 minutes before. Take your blood pressure before (not after) you eat. Sit  comfortably with your back supported and both feet on the floor (don't cross your legs). Elevate your arm to heart level on a table or a desk. Use the proper sized cuff. It should fit smoothly and snugly around your bare upper arm. There should be enough room to slip a fingertip under the cuff. The bottom edge of the cuff should be 1 inch above the crease of the elbow. Ideally, take  3 measurements at one sitting and record the average.     Adopting a Healthy Lifestyle.  Know what a healthy weight is for you (roughly BMI <25) and aim to maintain this   Aim for 7+ servings of fruits and vegetables daily   65-80+ fluid ounces of water or unsweet tea for healthy kidneys   Limit to max 1 drink of alcohol per day; avoid smoking/tobacco   Limit animal fats in diet for cholesterol and heart health - choose grass fed whenever available   Avoid highly processed foods, and foods high in saturated/trans fats   Aim for low stress - take time to unwind and care for your mental health   Aim for 150 min of moderate intensity exercise weekly for heart health, and weights twice weekly for bone health   Aim for 7-9 hours of sleep daily   When it comes to diets, agreement about the perfect plan isnt easy to find, even among the experts. Experts at the [facility] [employer] developed an idea known as the Healthy Eating Plate. Just imagine a plate divided into logical, healthy portions.   The emphasis is on diet quality:   Load up on vegetables and fruits - one-half of your plate: Aim for color and variety, and remember that potatoes dont count.   Go for whole grains - one-quarter of your plate: Whole wheat, barley, wheat berries, quinoa, oats, brown rice, and foods made with them. If you want pasta, go with whole wheat pasta.   Protein power - one-quarter of your plate: Fish, chicken, beans, and nuts are all healthy, versatile protein sources. Limit red meat.   The diet, however, does go  beyond the plate, offering a few other suggestions.   Use healthy plant oils, such as olive, canola, soy, corn, sunflower and peanut. Check the labels, and avoid partially hydrogenated oil, which have unhealthy trans fats.   If youre thirsty, drink water. Coffee and tea are good in moderation, but skip sugary drinks and limit milk and dairy products to one or two daily servings.   The type of carbohydrate in the diet is more important than the amount. Some sources of carbohydrates, such as vegetables, fruits, whole grains, and beans-are healthier than others.   Finally, stay active  Signed, Thomasene Ripple, DO  08/23/2023 9:34 AM    [facility]

## 2023-08-23 NOTE — Progress Notes (Signed)
Patient agreement reviewed and signed on 08/23/2023.  WatchPAT issued to patient on 08/23/2023 by Jeb Levering, RN. Patient aware to not open the WatchPAT box until contacted with the activation PIN. Patient profile initialized in CloudPAT on 08/23/2023 by Ashley Mariner, LPN. Device serial number: [id number]

## 2023-08-23 NOTE — Patient Instructions (Addendum)
Medication Instructions:  Your physician recommends that you continue on your current medications as directed. Please refer to the Current Medication list given to you today.  *If you need a refill on your cardiac medications before your next appointment, please call your pharmacy*   Lab Work: CMET, Mag If you have labs (blood work) drawn today and your tests are completely normal, you will receive your results only by: MyChart Message (if you have MyChart) OR A paper copy in the mail If you have any lab test that is abnormal or we need to change your treatment, we will call you to review the results.   Testing/Procedures: Your physician has requested that you have an echocardiogram. Echocardiography is a painless test that uses sound waves to create images of your heart. It provides your doctor with information about the size and shape of your heart and how well your heart's chambers and valves are working. This procedure takes approximately one hour. There are no restrictions for this procedure. Please do NOT wear cologne, perfume, aftershave, or lotions (deodorant is allowed). Please arrive 15 minutes prior to your appointment time.  Please note: We ask at that you not bring children with you during ultrasound (echo/ vascular) testing. Due to room size and safety concerns, children are not allowed in the ultrasound rooms during exams. Our front office staff cannot provide observation of children in our lobby area while testing is being conducted. An adult accompanying a patient to their appointment will only be allowed in the ultrasound room at the discretion of the ultrasound technician under special circumstances. We apologize for any inconvenience.    Your cardiac CT will be scheduled at one of the below locations:   [facility] [address] [place], [place] [address] [phone]  If scheduled at [facility], please arrive at the [facility] and Children's  Entrance (Entrance C2) of [facility] 30 minutes prior to test start time. You can use the FREE valet parking offered at entrance C (encouraged to control the heart rate for the test)  Proceed to the [facility] Radiology Department (first floor) to check-in and test prep.  All radiology patients and guests should use entrance C2 at [facility], accessed from [facility], even though the hospital's physical address listed is [address]     Please follow these instructions carefully (unless otherwise directed):  An IV will be required for this test and Nitroglycerin will be given.   On the Night Before the Test: Be sure to Drink plenty of water. Do not consume any caffeinated/decaffeinated beverages or chocolate 12 hours prior to your test. Do not take any antihistamines 12 hours prior to your test.  On the Day of the Test: Drink plenty of water until 1 hour prior to the test. Do not eat any food 1 hour prior to test. You may take your regular medications prior to the test.  Take metoprolol (Lopressor) two hours prior to test. If you take Furosemide/Hydrochlorothiazide/Spironolactone, please HOLD on the morning of the test. FEMALES- please wear underwire-free bra if available, avoid dresses & tight clothing  After the Test: Drink plenty of water. After receiving IV contrast, you may experience a mild flushed feeling. This is normal. On occasion, you may experience a mild rash up to 24 hours after the test. This is not dangerous. If this occurs, you can take Benadryl 25 mg and increase your fluid intake. If you experience trouble breathing, this can  be serious. If it is severe call 911 IMMEDIATELY. If it is mild, please call our office. If you take any of these medications: Glipizide/Metformin, Avandament, Glucavance, please do not take 48 hours after completing test unless otherwise instructed.  We will call to schedule your test 2-4 weeks out  understanding that some insurance companies will need an authorization prior to the service being performed.   For more information and frequently asked questions, please visit our website : [email or web address]  For non-scheduling related questions, please contact the cardiac imaging nurse navigator should you have any questions/concerns: Cardiac Imaging Nurse Navigators Direct Office Dial: [phone]   For scheduling needs, including cancellations and rescheduling, please call [place], [phone].    Follow-Up: At [facility], you and your health needs are our priority.  As part of our continuing mission to provide you with exceptional heart care, we have created designated Provider Care Teams.  These Care Teams include your primary Cardiologist (physician) and Advanced Practice Providers (APPs -  Physician Assistants and Nurse Practitioners) who all work together to provide you with the care you need, when you need it.   Your next appointment:   16 week(s)  Provider:   Thomasene Ripple, DO     Other Instructions Please take your blood pressure twice daily for 2 weeks and send in a MyChart message. Please include heart rates.   HOW TO TAKE YOUR BLOOD PRESSURE: Rest 5 minutes before taking your blood pressure. Don't smoke or drink caffeinated beverages for at least 30 minutes before. Take your blood pressure before (not after) you eat. Sit comfortably with your back supported and both feet on the floor (don't cross your legs). Elevate your arm to heart level on a table or a desk. Use the proper sized cuff. It should fit smoothly and snugly around your bare upper arm. There should be enough room to slip a fingertip under the cuff. The bottom edge of the cuff should be 1 inch above the crease of the elbow. Ideally, take 3 measurements at one sitting and record the average.

## 2023-08-24 LAB — COMPREHENSIVE METABOLIC PANEL
ALT: 19 [IU]/L (ref 0–32)
AST: 18 [IU]/L (ref 0–40)
Albumin: 4.3 g/dL (ref 3.8–4.9)
Alkaline Phosphatase: 117 [IU]/L (ref 44–121)
BUN/Creatinine Ratio: 29 — ABNORMAL HIGH (ref 9–23)
BUN: 18 mg/dL (ref 6–24)
Bilirubin Total: <0.2 mg/dL (ref 0.0–1.2)
CO2: 26 mmol/L (ref 20–29)
Calcium: 9.6 mg/dL (ref 8.7–10.2)
Chloride: 101 mmol/L (ref 96–106)
Creatinine, Ser: 0.63 mg/dL (ref 0.57–1.00)
Globulin, Total: 2.3 g/dL (ref 1.5–4.5)
Glucose: 93 mg/dL (ref 70–99)
Potassium: 4.6 mmol/L (ref 3.5–5.2)
Sodium: 140 mmol/L (ref 134–144)
Total Protein: 6.6 g/dL (ref 6.0–8.5)
eGFR: 104 mL/min/{1.73_m2} (ref >59)

## 2023-08-24 LAB — MAGNESIUM: Magnesium: 2.1 mg/dL (ref 1.6–2.3)

## 2023-08-31 ENCOUNTER — Telehealth: Payer: Self-pay | Admitting: Cardiology

## 2023-08-31 MED ORDER — METOPROLOL TARTRATE 100 MG PO TABS: ORAL_TABLET | ORAL | 0 refills | Status: AC

## 2023-08-31 NOTE — Telephone Encounter (Signed)
Pt c/o medication issue:  1. Name of Medication: metoprolol tartrate (LOPRESSOR) 100 MG tablet   2. How are you currently taking this medication (dosage and times per day)? Take 2 hours prior to CT   3. Are you having a reaction (difficulty breathing--STAT)? No  4. What is your medication issue? [facility] since it was not a 90 day supply. Patient would like [place] to send the prescription to the [facility] on [address]. Please advise.

## 2023-08-31 NOTE — Telephone Encounter (Signed)
Called pt regarding sending lopressor to local pharmacy. Medication sent in for upcoming coronary CTA. Pt does inquire about her itamar sleep study. No further documentation noted in the chart, explained this to pt. Pt verbalizes understanding.

## 2023-09-05 ENCOUNTER — Encounter (HOSPITAL_COMMUNITY): Payer: Self-pay

## 2023-09-07 ENCOUNTER — Ambulatory Visit (HOSPITAL_COMMUNITY)
Admission: RE | Admit: 2023-09-07 | Discharge: 2023-09-07 | Disposition: A | Discharge status: Home / Self Care | Payer: Managed Care, Other (non HMO) | Source: Ambulatory Visit | Attending: Cardiology | Admitting: Cardiology

## 2023-09-07 DIAGNOSIS — R072 Precordial pain: Secondary | ICD-10-CM | POA: Insufficient documentation

## 2023-09-07 MED ORDER — NITROGLYCERIN 0.4 MG SL SUBL
0.8000 mg | SUBLINGUAL_TABLET | Freq: Once | SUBLINGUAL | Status: AC
  Administered 2023-09-07: 0.8 mg via SUBLINGUAL

## 2023-09-07 MED ORDER — IOHEXOL 350 MG/ML SOLN
95.0000 mL | Freq: Once | INTRAVENOUS | Status: AC | PRN
  Administered 2023-09-07: 95 mL via INTRAVENOUS

## 2023-09-07 MED ORDER — NITROGLYCERIN 0.4 MG SL SUBL
SUBLINGUAL_TABLET | SUBLINGUAL | Status: AC
  Filled 2023-09-07: qty 2

## 2023-09-07 NOTE — Progress Notes (Signed)
Patient tolerated CT well. Vital signs stable encourage to drink water throughout day.Reasons explained and verbalized understanding. Ambulated steady gait.   

## 2023-10-04 ENCOUNTER — Ambulatory Visit (HOSPITAL_COMMUNITY): Payer: Managed Care, Other (non HMO) | Attending: Cardiology

## 2023-10-04 DIAGNOSIS — R0609 Other forms of dyspnea: Secondary | ICD-10-CM | POA: Diagnosis present

## 2023-10-04 LAB — ECHOCARDIOGRAM COMPLETE
Area-P 1/2: 3.95 cm2
S' Lateral: 3.2 cm

## 2023-10-04 MED ORDER — PERFLUTREN LIPID MICROSPHERE
1.0000 mL | INTRAVENOUS | Status: AC | PRN
  Administered 2023-10-04: 2 mL via INTRAVENOUS

## 2023-11-03 ENCOUNTER — Encounter: Payer: Self-pay | Admitting: Cardiology

## 2023-11-08 NOTE — Telephone Encounter (Signed)
 Patient identification verified by 2 forms. Bertina Cooks, RN    Received call from patient  Patient states:   -Reviewed mychart message with provider recommendations   -would like to know if she should continue taking diltiazem    -takes diltiazem  240mg  daily, has been taking for years   -[facility]  Patient has no further questions at this time

## 2023-11-08 NOTE — Telephone Encounter (Signed)
Karina Ripple, DO     11/04/23  1:49 PM Blood pressure not well controlled : please stop the current lisinopril-hydrochlorothiazide combination Start valsartan 40 mg and hydrochlorothiazide 12.5 mg . Please have her see the pharmacist team in 2 weeks

## 2023-11-08 NOTE — Telephone Encounter (Signed)
 Attempted to call patient, no answer left message requesting a call back.

## 2023-11-18 ENCOUNTER — Telehealth: Payer: Self-pay

## 2023-11-18 NOTE — Telephone Encounter (Signed)
**Note De-Identified Sair Faulcon Obfuscation** Ordering provider: Dr Servando Salina Associated diagnoses: Snorning-R06.83 and Palpitations-R00.2 WatchPAT PA obtained on 11/18/2023 by Maricel Swartzendruber, Lorelle Formosa, LPN. Authorization: I called [employer] and per the virtual assistant, a PA is not required for a [place]amar-HST (CPT Code: 40981). Patient notified of PIN (1234) on 11/18/2023 Sladen Plancarte Notification Method: phone.  Phone note routed to covering staff for follow-up.

## 2023-12-02 MED ORDER — VALSARTAN 40 MG PO TABS: 40.0000 mg | ORAL_TABLET | Freq: Every day | ORAL | 1 refills | Status: DC

## 2023-12-02 MED ORDER — HYDROCHLOROTHIAZIDE 12.5 MG PO CAPS: 12.5000 mg | ORAL_CAPSULE | Freq: Every day | ORAL | 1 refills | Status: DC

## 2023-12-02 NOTE — Telephone Encounter (Signed)
 Called pt to ask if she competed the sleep study. NO answer, left a message for pt to return the call or send a MyChart message.

## 2023-12-15 NOTE — Progress Notes (Signed)
 Dr. Servando Salina and Leavy Cella,  Karina Hawkins has been identified as a patient who may benefit from health coaching to develop healthy eating habits and increasing physical activity.  Please refer to [id number] or search for Care Navigation.  Karina Munda, MS, ERHD, [facility]  Care Guide, Health & Wellness Coach [address], [address] [place] [address] Telephone: [phone] Email: [email or web address]

## 2023-12-19 ENCOUNTER — Ambulatory Visit: Payer: Managed Care, Other (non HMO) | Admitting: Cardiology

## 2023-12-28 ENCOUNTER — Encounter (INDEPENDENT_AMBULATORY_CARE_PROVIDER_SITE_OTHER): Payer: Self-pay | Admitting: Cardiology

## 2023-12-28 DIAGNOSIS — R0683 Snoring: Secondary | ICD-10-CM | POA: Diagnosis not present

## 2023-12-28 DIAGNOSIS — R0602 Shortness of breath: Secondary | ICD-10-CM | POA: Diagnosis not present

## 2024-01-03 ENCOUNTER — Ambulatory Visit: Attending: Cardiology

## 2024-01-03 DIAGNOSIS — R0683 Snoring: Secondary | ICD-10-CM

## 2024-01-03 NOTE — Procedures (Signed)
   SLEEP STUDY REPORT Patient Information Study Date: 12/28/2023 Patient Name: Karina Hawkins Patient ID: [id number] Birth Date: 04-Jan-1966 Age: 58 Gender: Female BMI: 35.3 (W=211 lb, H=5' 5'') Stopbang: 7 Referring Physician: Thomasene Ripple, DO  TEST DESCRIPTION: Home sleep apnea testing was completed using the WatchPat, a Type 1 device, utilizing peripheral arterial tonometry (PAT), chest movement, actigraphy, pulse oximetry, pulse rate, body position and snore. AHI was calculated with apnea and hypopnea using valid sleep time as the denominator. RDI includes apneas, hypopneas, and RERAs. The data acquired and the scoring of sleep and all associated events were performed in accordance with the recommended standards and specifications as outlined in the AASM Manual for the Scoring of Sleep and Associated Events 2.2.0 (2015).  FINDINGS: 1. No evidence of Obstructive Sleep Apnea with AHI 2.6/hr. 2. No Central Sleep Apnea. 3. Oxygen desaturations as low as 81%. 4. Moderate snoring was present. O2 sats were < 88% for 0.76minutes. 5. Total sleep time was 6 hrs and 57 min. 6. 36.7% of total sleep time was spent in REM sleep. 7. Normal sleep onset latency at 13 min. 8. Shortened REM sleep onset latency at 82 min. 9. Total awakenings were 11.  DIAGNOSIS: Normal study with no significant sleep disordered breathing.  RECOMMENDATIONS: 1. Normal study with no significant sleep disordered breathing.  2. Healthy sleep recommendations include: adequate nightly sleep (normal 7-9 hrs/night), avoidance of caffeine after noon and alcohol near bedtime, and maintaining a sleep environment that is cool, dark and quiet.  3. Weight loss for overweight patients is recommended.  4. Snoring recommendations include: weight loss where appropriate, side sleeping, and avoidance of alcohol before bed.  5. Operation of motor vehicle or dangerous equipment must be avoided when feeling drowsy, excessively  sleepy, or mentally fatigued.  6. An ENT consultation which may be useful for specific causes of and possible treatment of bothersome snoring .  7. Weight loss may be of benefit in reducing the severity of snoring.   Signature: Armanda Magic, MD; [facility]; Diplomat, American Board of Sleep Medicine Electronically Signed: 01/03/2024 5:48:51 PM

## 2024-01-04 ENCOUNTER — Telehealth: Payer: Self-pay

## 2024-01-04 NOTE — Telephone Encounter (Signed)
-----   Message from Armanda Magic sent at 01/03/2024  5:50 PM EDT ----- Please let patient know that sleep study showed no significant sleep apnea.

## 2024-01-04 NOTE — Telephone Encounter (Signed)
Notified patient of sleep study results and recommendations. All questions (if any) were answered. Patient verbalized understanding.

## 2024-02-14 ENCOUNTER — Ambulatory Visit: Payer: Self-pay | Admitting: Cardiology

## 2024-03-13 ENCOUNTER — Encounter: Payer: Self-pay | Admitting: Cardiology

## 2024-03-13 ENCOUNTER — Ambulatory Visit: Attending: Cardiology | Admitting: Cardiology

## 2024-03-13 VITALS — BP 138/92 | HR 75 | Ht 65.0 in | Wt 204.4 lb

## 2024-03-13 DIAGNOSIS — I2510 Atherosclerotic heart disease of native coronary artery without angina pectoris: Secondary | ICD-10-CM | POA: Diagnosis not present

## 2024-03-13 DIAGNOSIS — I10 Essential (primary) hypertension: Secondary | ICD-10-CM

## 2024-03-13 MED ORDER — VALSARTAN 40 MG PO TABS: 40.0000 mg | ORAL_TABLET | Freq: Two times a day (BID) | ORAL | 3 refills | Status: AC

## 2024-03-13 NOTE — Progress Notes (Signed)
 Cardiology Office Note:    Date:  03/17/2024   ID:  Karina Hawkins, DOB 21-May-1966, MRN [id number]  PCP:  Windell Hasty, DO  Cardiologist:  Jerryl Morin, DO  Electrophysiologist:  None   Referring MD: Windell Hasty, DO      History of Present Illness:    Karina Hawkins is a 58 y.o. female with a hx of hypertension, GERD, Obesity, and mild coronary artery disease who presents for blood pressure management.  She is adjusting to a medication change from lisinopril  to Cardizem , HCTZ, and valsartan . Blood pressure averages 133/82 mmHg with occasional spikes, particularly after high sodium foods or pork. She avoids these foods and notices improvements with intermittent fasting and home-cooked meals. Blood pressure readings fluctuate, with some as low as 118/75 mmHg. She monitors her blood pressure multiple times daily, especially before consuming coffee.  Lifestyle changes include intermittent fasting, increased water intake, and exercise to manage weight and blood pressure. She drinks about 128 ounces of water daily and has reduced sweets and bread. Occasional dizziness occurs, possibly related to blood pressure or blood sugar fluctuations. She uses a blood sugar monitor. No diabetes, with a hemoglobin A1c of 5.4 at the last check.  Past Medical History:  Diagnosis Date   Anxiety    Edema, lower extremity    Esophageal reflux    GERD (gastroesophageal reflux disease)    HTN (hypertension)    Lower back pain    Overweight(278.02)    Pneumonia     Past Surgical History:  Procedure Laterality Date   ANAL FISSURE REPAIR  1993   Dr Nolon Baxter   FOOT SURGERY  2002   bunion repair   LUMBAR SPINE SURGERY     TONSILLECTOMY  Age 59   w/ Adenoids   TUBAL LIGATION      Current Medications: Current Meds  Medication Sig   diltiazem  (CARDIZEM  CD) 240 MG 24 hr capsule TAKE 1 CAPSULE BY MOUTH EVERY DAY   pantoprazole  (PROTONIX ) 40 MG tablet Take 40 mg by mouth daily.   valsartan  (DIOVAN ) 40  MG tablet Take 1 tablet (40 mg total) by mouth 2 (two) times daily.   [DISCONTINUED] valsartan  (DIOVAN ) 40 MG tablet Take 1 tablet (40 mg total) by mouth daily.     Allergies:   Penicillins and Clarithromycin   Social History   Socioeconomic History   Marital status: Married    Spouse name: Lewis   Number of children: 2   Years of education: Not on file   Highest education level: Not on file  Occupational History   Occupation: Armed forces operational officer: [employer]  Tobacco Use   Smoking status: Never   Smokeless tobacco: Never  Substance and Sexual Activity   Alcohol use: Yes    Alcohol/week: 0.0 standard drinks of alcohol    Comment: socially 1x per month   Drug use: No   Sexual activity: Yes    Partners: Male  Other Topics Concern   Not on file  Social History Narrative   Exercise--no   Social Drivers of Corporate investment banker Strain: Not on file  Food Insecurity: Not on file  Transportation Needs: Not on file  Physical Activity: Not on file  Stress: Not on file  Social Connections: Not on file     Family History: The patient's family history includes Anxiety disorder in her father and mother; Breast cancer in her maternal aunt, maternal grandmother, and paternal grandmother; Depression in her mother; Diabetes  in her mother; Emphysema in her maternal grandfather; Hyperlipidemia in her father and mother; Hypertension in her father, maternal grandfather, maternal grandmother, mother, paternal grandfather, and paternal grandmother; Obesity in her father and mother; Rheum arthritis in her mother; Stroke in her mother. There is no history of Colon cancer, Rectal cancer, Stomach cancer, or Esophageal cancer.  ROS:   Review of Systems  Constitution: Negative for decreased appetite, fever and weight gain.  HENT: Negative for congestion, ear discharge, hoarse voice and sore throat.   Eyes: Negative for discharge, redness, vision loss in right eye and visual halos.   Cardiovascular: Negative for chest pain, dyspnea on exertion, leg swelling, orthopnea and palpitations.  Respiratory: Negative for cough, hemoptysis, shortness of breath and snoring.   Endocrine: Negative for heat intolerance and polyphagia.  Hematologic/Lymphatic: Negative for bleeding problem. Does not bruise/bleed easily.  Skin: Negative for flushing, nail changes, rash and suspicious lesions.  Musculoskeletal: Negative for arthritis, joint pain, muscle cramps, myalgias, neck pain and stiffness.  Gastrointestinal: Negative for abdominal pain, bowel incontinence, diarrhea and excessive appetite.  Genitourinary: Negative for decreased libido, genital sores and incomplete emptying.  Neurological: Negative for brief paralysis, focal weakness, headaches and loss of balance.  Psychiatric/Behavioral: Negative for altered mental status, depression and suicidal ideas.  Allergic/Immunologic: Negative for HIV exposure and persistent infections.    EKGs/Labs/Other Studies Reviewed:    The following studies were reviewed today:   EKG:  The ekg ordered today demonstrates   Recent Labs: 08/23/2023: ALT 19; BUN 18; Creatinine, Ser 0.63; Magnesium  2.1; Potassium 4.6; Sodium 140  Recent Lipid Panel    Component Value Date/Time   CHOL 164 10/30/2019 1251   TRIG 80 10/30/2019 1251   HDL 63 10/30/2019 1251   CHOLHDL 3 06/21/2019 1612   VLDL 12.4 06/21/2019 1612   LDLCALC 86 10/30/2019 1251    Physical Exam:    VS:  BP (!) 138/92 (BP Location: Left Arm, Patient Position: Sitting, Cuff Size: Normal)   Pulse 75   Ht 5' 5 (1.651 m)   Wt 204 lb 6.4 oz (92.7 kg)   LMP 09/23/2015   SpO2 98%   BMI 34.01 kg/m     Wt Readings from Last 3 Encounters:  03/13/24 204 lb 6.4 oz (92.7 kg)  08/23/23 212 lb 9.6 oz (96.4 kg)  12/19/19 182 lb (82.6 kg)     GEN: Well nourished, well developed in no acute distress HEENT: Normal NECK: No JVD; No carotid bruits LYMPHATICS: No lymphadenopathy CARDIAC:  S1S2 noted,RRR, no murmurs, rubs, gallops RESPIRATORY:  Clear to auscultation without rales, wheezing or rhonchi  ABDOMEN: Soft, non-tender, non-distended, +bowel sounds, no guarding. EXTREMITIES: No edema, No cyanosis, no clubbing MUSCULOSKELETAL:  No deformity  SKIN: Warm and dry NEUROLOGIC:  Alert and oriented x 3, non-focal PSYCHIATRIC:  Normal affect, good insight  ASSESSMENT:    1. Mild CAD   2. Essential hypertension    PLAN:     Hypertension Fluctuating blood pressure, influenced by sodium intake. Current regimen includes Cardizem , HCTZ, and valsartan . Valsartan  increased to optimize control. - Increase valsartan  to 40 mg twice daily. - Continue daily blood pressure monitoring at the same time each day. - Advise to avoid high sodium foods and continue dietary modifications. - Monitor for symptoms of hypotension and adjust medication as needed.  Mild coronary artery disease Mild plaques on CT scan. LDL goal is less than 70 for prevention. No recent blood work since September. - Obtain copy of blood work results from September  physical. - If LDL is greater than 70, consider starting low cholesterol medication.  The patient is in agreement with the above plan. The patient left the office in stable condition.  The patient will follow up in   Medication Adjustments/Labs and Tests Ordered: Current medicines are reviewed at length with the patient today.  Concerns regarding medicines are outlined above.  No orders of the defined types were placed in this encounter.  Meds ordered this encounter  Medications   valsartan  (DIOVAN ) 40 MG tablet    Sig: Take 1 tablet (40 mg total) by mouth 2 (two) times daily.    Dispense:  180 tablet    Refill:  3    Patient Instructions  Medication Instructions:  - START VALSARTAN  40 MG TWICE DAILY    *If you need a refill on your cardiac medications before your next appointment, please call your pharmacy*   Lab Work: NONE    If  you have labs (blood work) drawn today and your tests are completely normal, you will receive your results only by: MyChart Message (if you have MyChart) OR A paper copy in the mail If you have any lab test that is abnormal or we need to change your treatment, we will call you to review the results.   Testing/Procedures: NONE    Follow-Up: At [facility], you and your health needs are our priority.  As part of our continuing mission to provide you with exceptional heart care, we have created designated Provider Care Teams.  These Care Teams include your primary Cardiologist (physician) and Advanced Practice Providers (APPs -  Physician Assistants and Nurse Practitioners) who all work together to provide you with the care you need, when you need it.  We recommend signing up for the patient portal called MyChart.  Sign up information is provided on this After Visit Summary.  MyChart is used to connect with patients for Virtual Visits (Telemedicine).  Patients are able to view lab/test results, encounter notes, upcoming appointments, etc.  Non-urgent messages can be sent to your provider as well.   To learn more about what you can do with MyChart, go to [email or web address].    Your next appointment:   6 month(s)  The format for your next appointment:   In Person  Provider:   Yassmine Tamm, DO   Other Instructions     Adopting a Healthy Lifestyle.  Know what a healthy weight is for you (roughly BMI <25) and aim to maintain this   Aim for 7+ servings of fruits and vegetables daily   65-80+ fluid ounces of water or unsweet tea for healthy kidneys   Limit to max 1 drink of alcohol per day; avoid smoking/tobacco   Limit animal fats in diet for cholesterol and heart health - choose grass fed whenever available   Avoid highly processed foods, and foods high in saturated/trans fats   Aim for low stress - take time to unwind and care for your mental health   Aim for 150 min of  moderate intensity exercise weekly for heart health, and weights twice weekly for bone health   Aim for 7-9 hours of sleep daily   When it comes to diets, agreement about the perfect plan isnt easy to find, even among the experts. Experts at the [facility] of [employer] developed an idea known as the Healthy Eating Plate. Just imagine a plate divided into logical, healthy portions.   The emphasis is on diet quality:   Load up  on vegetables and fruits - one-half of your plate: Aim for color and variety, and remember that potatoes dont count.   Go for whole grains - one-quarter of your plate: Whole wheat, barley, wheat berries, quinoa, oats, brown rice, and foods made with them. If you want pasta, go with whole wheat pasta.   Protein power - one-quarter of your plate: Fish, chicken, beans, and nuts are all healthy, versatile protein sources. Limit red meat.   The diet, however, does go beyond the plate, offering a few other suggestions.   Use healthy plant oils, such as olive, canola, soy, corn, sunflower and peanut. Check the labels, and avoid partially hydrogenated oil, which have unhealthy trans fats.   If youre thirsty, drink water. Coffee and tea are good in moderation, but skip sugary drinks and limit milk and dairy products to one or two daily servings.   The type of carbohydrate in the diet is more important than the amount. Some sources of carbohydrates, such as vegetables, fruits, whole grains, and beans-are healthier than others.   Finally, stay active  Signed, Jerryl Morin, DO  03/17/2024 6:50 AM    [facility]

## 2024-03-13 NOTE — Patient Instructions (Signed)
 Medication Instructions:  - START VALSARTAN  40 MG TWICE DAILY    *If you need a refill on your cardiac medications before your next appointment, please call your pharmacy*   Lab Work: NONE    If you have labs (blood work) drawn today and your tests are completely normal, you will receive your results only by: MyChart Message (if you have MyChart) OR A paper copy in the mail If you have any lab test that is abnormal or we need to change your treatment, we will call you to review the results.   Testing/Procedures: NONE    Follow-Up: At [facility], you and your health needs are our priority.  As part of our continuing mission to provide you with exceptional heart care, we have created designated Provider Care Teams.  These Care Teams include your primary Cardiologist (physician) and Advanced Practice Providers (APPs -  Physician Assistants and Nurse Practitioners) who all work together to provide you with the care you need, when you need it.  We recommend signing up for the patient portal called "MyChart".  Sign up information is provided on this After Visit Summary.  MyChart is used to connect with patients for Virtual Visits (Telemedicine).  Patients are able to view lab/test results, encounter notes, upcoming appointments, etc.  Non-urgent messages can be sent to your provider as well.   To learn more about what you can do with MyChart, go to [email or web address].    Your next appointment:   6 month(s)  The format for your next appointment:   In Person  Provider:   Kardie Tobb, DO   Other Instructions

## 2024-05-14 ENCOUNTER — Other Ambulatory Visit: Payer: Self-pay | Admitting: Cardiology

## 2024-05-28 ENCOUNTER — Other Ambulatory Visit: Payer: Self-pay | Admitting: Obstetrics & Gynecology

## 2024-05-28 DIAGNOSIS — N6489 Other specified disorders of breast: Secondary | ICD-10-CM

## 2024-05-28 DIAGNOSIS — Z09 Encounter for follow-up examination after completed treatment for conditions other than malignant neoplasm: Secondary | ICD-10-CM

## 2024-06-15 ENCOUNTER — Ambulatory Visit
Admission: RE | Admit: 2024-06-15 | Discharge: 2024-06-15 | Disposition: A | Discharge status: Home / Self Care | Source: Ambulatory Visit | Attending: Obstetrics & Gynecology | Admitting: Obstetrics & Gynecology

## 2024-06-15 DIAGNOSIS — Z09 Encounter for follow-up examination after completed treatment for conditions other than malignant neoplasm: Secondary | ICD-10-CM

## 2024-06-15 DIAGNOSIS — N6489 Other specified disorders of breast: Secondary | ICD-10-CM

## 2024-06-18 ENCOUNTER — Telehealth: Payer: Self-pay | Admitting: Cardiology

## 2024-06-18 MED ORDER — HYDROCHLOROTHIAZIDE 12.5 MG PO CAPS: 12.5000 mg | ORAL_CAPSULE | Freq: Every day | ORAL | 1 refills | Status: AC

## 2024-06-18 NOTE — Telephone Encounter (Signed)
 RX sent in

## 2024-06-18 NOTE — Telephone Encounter (Signed)
*  STAT* If patient is at the pharmacy, call can be transferred to refill team.   1. Which medications need to be refilled? (please list name of each medication and dose if known) hydrochlorothiazide  (MICROZIDE ) 12.5 MG capsule    2. Would you like to learn more about the convenience, safety, & potential cost savings by using the [facility]?      3. Are you open to using the [facility] (Type [facility].  ).   4. Which pharmacy/location (including street and city if local pharmacy) is medication to be sent to? [id number] - [place], [place] - [address]    5. Do they need a 30 day or 90 day supply? 90 day Patient only has 1 capsule left.

## 2024-09-12 ENCOUNTER — Encounter: Payer: Self-pay | Admitting: *Deleted

## 2024-09-14 ENCOUNTER — Ambulatory Visit: Admitting: Cardiology

## 2024-12-05 ENCOUNTER — Ambulatory Visit: Admitting: Cardiology
